# Patient Record
Sex: Female | Born: 1950 | Race: White | Hispanic: No | Marital: Single | Smoking: Former smoker
Health system: Southern US, Community
[De-identification: ages and names within clinical notes are randomized; demographics above are authoritative.]

## PROBLEM LIST (undated history)

## (undated) DIAGNOSIS — K219 Gastro-esophageal reflux disease without esophagitis: Secondary | ICD-10-CM

## (undated) DIAGNOSIS — I10 Essential (primary) hypertension: Secondary | ICD-10-CM

## (undated) DIAGNOSIS — F329 Major depressive disorder, single episode, unspecified: Secondary | ICD-10-CM

## (undated) DIAGNOSIS — Z87442 Personal history of urinary calculi: Secondary | ICD-10-CM

## (undated) DIAGNOSIS — M810 Age-related osteoporosis without current pathological fracture: Secondary | ICD-10-CM

## (undated) DIAGNOSIS — M1990 Unspecified osteoarthritis, unspecified site: Secondary | ICD-10-CM

## (undated) DIAGNOSIS — Z8489 Family history of other specified conditions: Secondary | ICD-10-CM

## (undated) DIAGNOSIS — F32A Depression, unspecified: Secondary | ICD-10-CM

## (undated) HISTORY — PX: ADENOIDECTOMY: SUR15

## (undated) HISTORY — DX: Age-related osteoporosis without current pathological fracture: M81.0

## (undated) HISTORY — PX: SINUS EXPLORATION: SHX5214

## (undated) HISTORY — PX: EYE SURGERY: SHX253

## (undated) HISTORY — PX: THIGH LIFT: SHX2496

---
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
## 1997-11-13 ENCOUNTER — Other Ambulatory Visit: Admission: RE | Admit: 1997-11-13 | Discharge: 1997-11-13 | Payer: Self-pay | Admitting: Family Medicine

## 2000-01-03 HISTORY — PX: ROUX-EN-Y GASTRIC BYPASS: SHX1104

## 2000-02-03 ENCOUNTER — Encounter: Admission: RE | Admit: 2000-02-03 | Discharge: 2000-05-03 | Payer: Self-pay | Admitting: Surgical Oncology

## 2001-03-04 ENCOUNTER — Encounter: Payer: Self-pay | Admitting: Urology

## 2001-03-04 ENCOUNTER — Ambulatory Visit (HOSPITAL_BASED_OUTPATIENT_CLINIC_OR_DEPARTMENT_OTHER): Admission: RE | Admit: 2001-03-04 | Discharge: 2001-03-04 | Payer: Self-pay | Admitting: Urology

## 2003-10-09 ENCOUNTER — Other Ambulatory Visit: Admission: RE | Admit: 2003-10-09 | Discharge: 2003-10-09 | Payer: Self-pay | Admitting: Family Medicine

## 2003-12-24 ENCOUNTER — Encounter: Admission: RE | Admit: 2003-12-24 | Discharge: 2003-12-24 | Payer: Self-pay | Admitting: Otolaryngology

## 2004-11-28 ENCOUNTER — Encounter: Admission: RE | Admit: 2004-11-28 | Discharge: 2004-11-28 | Payer: Self-pay | Admitting: Otolaryngology

## 2005-05-02 ENCOUNTER — Ambulatory Visit: Payer: Self-pay | Admitting: Sports Medicine

## 2007-01-29 ENCOUNTER — Other Ambulatory Visit: Admission: RE | Admit: 2007-01-29 | Discharge: 2007-01-29 | Payer: Self-pay | Admitting: Family Medicine

## 2009-05-05 ENCOUNTER — Inpatient Hospital Stay (HOSPITAL_COMMUNITY): Admission: EM | Admit: 2009-05-05 | Discharge: 2009-05-06 | Payer: Self-pay | Admitting: Emergency Medicine

## 2010-03-18 ENCOUNTER — Other Ambulatory Visit (HOSPITAL_COMMUNITY)
Admission: RE | Admit: 2010-03-18 | Discharge: 2010-03-18 | Disposition: A | Discharge status: Home / Self Care | Payer: Commercial Managed Care - PPO | Source: Ambulatory Visit | Attending: Family Medicine | Admitting: Family Medicine

## 2010-03-18 ENCOUNTER — Other Ambulatory Visit: Payer: Self-pay | Admitting: Family Medicine

## 2010-03-18 DIAGNOSIS — Z124 Encounter for screening for malignant neoplasm of cervix: Secondary | ICD-10-CM | POA: Insufficient documentation

## 2010-03-22 LAB — POCT I-STAT, CHEM 8
Chloride: 109 mEq/L (ref 96–112)
HCT: 39 % (ref 36.0–46.0)
Hemoglobin: 13.3 g/dL (ref 12.0–15.0)
Potassium: 3.3 mEq/L — ABNORMAL LOW (ref 3.5–5.1)
Sodium: 140 mEq/L (ref 135–145)

## 2010-03-22 LAB — COMPREHENSIVE METABOLIC PANEL
ALT: 15 U/L (ref 0–35)
AST: 16 U/L (ref 0–37)
AST: 24 U/L (ref 0–37)
Albumin: 2.3 g/dL — ABNORMAL LOW (ref 3.5–5.2)
Albumin: 2.6 g/dL — ABNORMAL LOW (ref 3.5–5.2)
Alkaline Phosphatase: 81 U/L (ref 39–117)
BUN: 15 mg/dL (ref 6–23)
BUN: 28 mg/dL — ABNORMAL HIGH (ref 6–23)
BUN: 9 mg/dL (ref 6–23)
CO2: 25 mEq/L (ref 19–32)
Calcium: 7.9 mg/dL — ABNORMAL LOW (ref 8.4–10.5)
Calcium: 8.8 mg/dL (ref 8.4–10.5)
Chloride: 110 mEq/L (ref 96–112)
Creatinine, Ser: 0.65 mg/dL (ref 0.4–1.2)
Creatinine, Ser: 1.11 mg/dL (ref 0.4–1.2)
GFR calc Af Amer: >60 mL/min (ref >60)
GFR calc Af Amer: >60 mL/min (ref >60)
GFR calc non Af Amer: 50 mL/min — ABNORMAL LOW (ref >60)
Potassium: 3.5 mEq/L (ref 3.5–5.1)
Sodium: 140 mEq/L (ref 135–145)
Total Bilirubin: 0.6 mg/dL (ref 0.3–1.2)
Total Bilirubin: 0.7 mg/dL (ref 0.3–1.2)
Total Protein: 4.7 g/dL — ABNORMAL LOW (ref 6.0–8.3)
Total Protein: 5.2 g/dL — ABNORMAL LOW (ref 6.0–8.3)

## 2010-03-22 LAB — DIFFERENTIAL
Basophils Absolute: 0 10*3/uL (ref 0.0–0.1)
Basophils Relative: 0 % (ref 0–1)
Eosinophils Absolute: 0.1 10*3/uL (ref 0.0–0.7)
Eosinophils Absolute: 0.3 10*3/uL (ref 0.0–0.7)
Eosinophils Relative: 3 % (ref 0–5)
Lymphocytes Relative: 14 % (ref 12–46)
Lymphocytes Relative: 5 % — ABNORMAL LOW (ref 12–46)
Monocytes Relative: 13 % — ABNORMAL HIGH (ref 3–12)
Monocytes Relative: 8 % (ref 3–12)
Neutro Abs: 6.8 10*3/uL (ref 1.7–7.7)
Neutro Abs: 8.8 10*3/uL — ABNORMAL HIGH (ref 1.7–7.7)
Neutrophils Relative %: 70 % (ref 43–77)
Neutrophils Relative %: 86 % — ABNORMAL HIGH (ref 43–77)
WBC Morphology: INCREASED

## 2010-03-22 LAB — CBC
HCT: 33.6 % — ABNORMAL LOW (ref 36.0–46.0)
HCT: 35.3 % — ABNORMAL LOW (ref 36.0–46.0)
HCT: 44.7 % (ref 36.0–46.0)
Hemoglobin: 11.6 g/dL — ABNORMAL LOW (ref 12.0–15.0)
Hemoglobin: 15.5 g/dL — ABNORMAL HIGH (ref 12.0–15.0)
MCHC: 34.6 g/dL (ref 30.0–36.0)
MCHC: 34.7 g/dL (ref 30.0–36.0)
MCV: 89.5 fL (ref 78.0–100.0)
MCV: 90.4 fL (ref 78.0–100.0)
Platelets: 198 10*3/uL (ref 150–400)
Platelets: 225 10*3/uL (ref 150–400)
Platelets: 307 10*3/uL (ref 150–400)
RBC: 4.95 MIL/uL (ref 3.87–5.11)
RDW: 12.6 % (ref 11.5–15.5)
RDW: 12.8 % (ref 11.5–15.5)
RDW: 13.1 % (ref 11.5–15.5)
WBC: 10.2 10*3/uL (ref 4.0–10.5)
WBC: 8.9 10*3/uL (ref 4.0–10.5)
WBC: 9.8 10*3/uL (ref 4.0–10.5)

## 2010-03-22 LAB — TISSUE TRANSGLUTAMINASE, IGA: Tissue Transglutaminase Ab, IgA: 10.4 U/mL (ref <20)

## 2010-03-22 LAB — CK TOTAL AND CKMB (NOT AT ARMC)
CK, MB: 3.5 ng/mL (ref 0.3–4.0)
Relative Index: INVALID (ref 0.0–2.5)
Total CK: 92 U/L (ref 7–177)

## 2010-03-22 LAB — FECAL LACTOFERRIN, QUANT

## 2010-03-22 LAB — STOOL CULTURE

## 2010-03-22 LAB — CLOSTRIDIUM DIFFICILE EIA

## 2010-03-22 LAB — LIPASE, BLOOD: Lipase: 13 U/L (ref 11–59)

## 2010-03-22 LAB — POTASSIUM: Potassium: 2.9 mEq/L — ABNORMAL LOW (ref 3.5–5.1)

## 2010-03-22 LAB — MAGNESIUM: Magnesium: 1.7 mg/dL (ref 1.5–2.5)

## 2013-05-14 ENCOUNTER — Other Ambulatory Visit: Payer: Self-pay | Admitting: Family Medicine

## 2013-05-14 ENCOUNTER — Other Ambulatory Visit (HOSPITAL_COMMUNITY)
Admission: RE | Admit: 2013-05-14 | Discharge: 2013-05-14 | Disposition: A | Discharge status: Home / Self Care | Payer: Commercial Managed Care - PPO | Source: Ambulatory Visit | Attending: Family Medicine | Admitting: Family Medicine

## 2013-05-14 DIAGNOSIS — Z124 Encounter for screening for malignant neoplasm of cervix: Secondary | ICD-10-CM | POA: Insufficient documentation

## 2015-06-17 DIAGNOSIS — Z961 Presence of intraocular lens: Secondary | ICD-10-CM | POA: Diagnosis not present

## 2015-06-18 DIAGNOSIS — Z23 Encounter for immunization: Secondary | ICD-10-CM | POA: Diagnosis not present

## 2015-06-18 DIAGNOSIS — Z0000 Encounter for general adult medical examination without abnormal findings: Secondary | ICD-10-CM | POA: Diagnosis not present

## 2015-06-18 DIAGNOSIS — Z1211 Encounter for screening for malignant neoplasm of colon: Secondary | ICD-10-CM | POA: Diagnosis not present

## 2015-06-18 DIAGNOSIS — E559 Vitamin D deficiency, unspecified: Secondary | ICD-10-CM | POA: Diagnosis not present

## 2015-06-18 DIAGNOSIS — I10 Essential (primary) hypertension: Secondary | ICD-10-CM | POA: Diagnosis not present

## 2015-06-18 DIAGNOSIS — M5136 Other intervertebral disc degeneration, lumbar region: Secondary | ICD-10-CM | POA: Diagnosis not present

## 2015-06-18 DIAGNOSIS — Z9884 Bariatric surgery status: Secondary | ICD-10-CM | POA: Diagnosis not present

## 2015-06-18 DIAGNOSIS — K912 Postsurgical malabsorption, not elsewhere classified: Secondary | ICD-10-CM | POA: Diagnosis not present

## 2015-06-18 DIAGNOSIS — Z13820 Encounter for screening for osteoporosis: Secondary | ICD-10-CM | POA: Diagnosis not present

## 2015-06-21 DIAGNOSIS — R69 Illness, unspecified: Secondary | ICD-10-CM | POA: Diagnosis not present

## 2015-06-23 DIAGNOSIS — Z1211 Encounter for screening for malignant neoplasm of colon: Secondary | ICD-10-CM | POA: Diagnosis not present

## 2015-07-01 DIAGNOSIS — Z13820 Encounter for screening for osteoporosis: Secondary | ICD-10-CM | POA: Diagnosis not present

## 2015-07-01 DIAGNOSIS — M8588 Other specified disorders of bone density and structure, other site: Secondary | ICD-10-CM | POA: Diagnosis not present

## 2015-07-16 DIAGNOSIS — Z1239 Encounter for other screening for malignant neoplasm of breast: Secondary | ICD-10-CM | POA: Diagnosis not present

## 2015-07-16 DIAGNOSIS — Z1231 Encounter for screening mammogram for malignant neoplasm of breast: Secondary | ICD-10-CM | POA: Diagnosis not present

## 2015-07-22 DIAGNOSIS — M9903 Segmental and somatic dysfunction of lumbar region: Secondary | ICD-10-CM | POA: Diagnosis not present

## 2015-07-22 DIAGNOSIS — M9905 Segmental and somatic dysfunction of pelvic region: Secondary | ICD-10-CM | POA: Diagnosis not present

## 2015-07-26 DIAGNOSIS — M5136 Other intervertebral disc degeneration, lumbar region: Secondary | ICD-10-CM | POA: Diagnosis not present

## 2015-07-26 DIAGNOSIS — M62830 Muscle spasm of back: Secondary | ICD-10-CM | POA: Diagnosis not present

## 2015-07-26 DIAGNOSIS — M5137 Other intervertebral disc degeneration, lumbosacral region: Secondary | ICD-10-CM | POA: Diagnosis not present

## 2015-07-26 DIAGNOSIS — M9903 Segmental and somatic dysfunction of lumbar region: Secondary | ICD-10-CM | POA: Diagnosis not present

## 2015-07-26 DIAGNOSIS — M5386 Other specified dorsopathies, lumbar region: Secondary | ICD-10-CM | POA: Diagnosis not present

## 2015-07-26 DIAGNOSIS — M9905 Segmental and somatic dysfunction of pelvic region: Secondary | ICD-10-CM | POA: Diagnosis not present

## 2015-07-26 DIAGNOSIS — M4606 Spinal enthesopathy, lumbar region: Secondary | ICD-10-CM | POA: Diagnosis not present

## 2015-07-26 DIAGNOSIS — M545 Low back pain: Secondary | ICD-10-CM | POA: Diagnosis not present

## 2015-07-27 DIAGNOSIS — M25561 Pain in right knee: Secondary | ICD-10-CM | POA: Diagnosis not present

## 2015-07-27 DIAGNOSIS — M9903 Segmental and somatic dysfunction of lumbar region: Secondary | ICD-10-CM | POA: Diagnosis not present

## 2015-07-27 DIAGNOSIS — M9905 Segmental and somatic dysfunction of pelvic region: Secondary | ICD-10-CM | POA: Diagnosis not present

## 2015-07-27 DIAGNOSIS — M1711 Unilateral primary osteoarthritis, right knee: Secondary | ICD-10-CM | POA: Diagnosis not present

## 2015-07-28 DIAGNOSIS — M5386 Other specified dorsopathies, lumbar region: Secondary | ICD-10-CM | POA: Diagnosis not present

## 2015-07-28 DIAGNOSIS — M9903 Segmental and somatic dysfunction of lumbar region: Secondary | ICD-10-CM | POA: Diagnosis not present

## 2015-07-28 DIAGNOSIS — M4606 Spinal enthesopathy, lumbar region: Secondary | ICD-10-CM | POA: Diagnosis not present

## 2015-07-28 DIAGNOSIS — M9905 Segmental and somatic dysfunction of pelvic region: Secondary | ICD-10-CM | POA: Diagnosis not present

## 2015-08-02 DIAGNOSIS — M9905 Segmental and somatic dysfunction of pelvic region: Secondary | ICD-10-CM | POA: Diagnosis not present

## 2015-08-02 DIAGNOSIS — M1711 Unilateral primary osteoarthritis, right knee: Secondary | ICD-10-CM | POA: Diagnosis not present

## 2015-08-02 DIAGNOSIS — M9903 Segmental and somatic dysfunction of lumbar region: Secondary | ICD-10-CM | POA: Diagnosis not present

## 2015-08-02 DIAGNOSIS — M25561 Pain in right knee: Secondary | ICD-10-CM | POA: Diagnosis not present

## 2015-08-03 DIAGNOSIS — M25561 Pain in right knee: Secondary | ICD-10-CM | POA: Diagnosis not present

## 2015-08-03 DIAGNOSIS — M1711 Unilateral primary osteoarthritis, right knee: Secondary | ICD-10-CM | POA: Diagnosis not present

## 2015-08-03 DIAGNOSIS — M9903 Segmental and somatic dysfunction of lumbar region: Secondary | ICD-10-CM | POA: Diagnosis not present

## 2015-08-03 DIAGNOSIS — M9905 Segmental and somatic dysfunction of pelvic region: Secondary | ICD-10-CM | POA: Diagnosis not present

## 2015-08-04 DIAGNOSIS — M4606 Spinal enthesopathy, lumbar region: Secondary | ICD-10-CM | POA: Diagnosis not present

## 2015-08-04 DIAGNOSIS — M9903 Segmental and somatic dysfunction of lumbar region: Secondary | ICD-10-CM | POA: Diagnosis not present

## 2015-08-04 DIAGNOSIS — M5386 Other specified dorsopathies, lumbar region: Secondary | ICD-10-CM | POA: Diagnosis not present

## 2015-08-04 DIAGNOSIS — M9905 Segmental and somatic dysfunction of pelvic region: Secondary | ICD-10-CM | POA: Diagnosis not present

## 2015-08-09 DIAGNOSIS — M9905 Segmental and somatic dysfunction of pelvic region: Secondary | ICD-10-CM | POA: Diagnosis not present

## 2015-08-09 DIAGNOSIS — M1711 Unilateral primary osteoarthritis, right knee: Secondary | ICD-10-CM | POA: Diagnosis not present

## 2015-08-09 DIAGNOSIS — M9903 Segmental and somatic dysfunction of lumbar region: Secondary | ICD-10-CM | POA: Diagnosis not present

## 2015-08-09 DIAGNOSIS — M25561 Pain in right knee: Secondary | ICD-10-CM | POA: Diagnosis not present

## 2015-08-10 DIAGNOSIS — M1711 Unilateral primary osteoarthritis, right knee: Secondary | ICD-10-CM | POA: Diagnosis not present

## 2015-08-10 DIAGNOSIS — M9905 Segmental and somatic dysfunction of pelvic region: Secondary | ICD-10-CM | POA: Diagnosis not present

## 2015-08-10 DIAGNOSIS — M25561 Pain in right knee: Secondary | ICD-10-CM | POA: Diagnosis not present

## 2015-08-10 DIAGNOSIS — M9903 Segmental and somatic dysfunction of lumbar region: Secondary | ICD-10-CM | POA: Diagnosis not present

## 2015-08-11 DIAGNOSIS — M9903 Segmental and somatic dysfunction of lumbar region: Secondary | ICD-10-CM | POA: Diagnosis not present

## 2015-08-11 DIAGNOSIS — M5386 Other specified dorsopathies, lumbar region: Secondary | ICD-10-CM | POA: Diagnosis not present

## 2015-08-11 DIAGNOSIS — M9905 Segmental and somatic dysfunction of pelvic region: Secondary | ICD-10-CM | POA: Diagnosis not present

## 2015-08-11 DIAGNOSIS — M4606 Spinal enthesopathy, lumbar region: Secondary | ICD-10-CM | POA: Diagnosis not present

## 2015-08-16 DIAGNOSIS — M9905 Segmental and somatic dysfunction of pelvic region: Secondary | ICD-10-CM | POA: Diagnosis not present

## 2015-08-16 DIAGNOSIS — M1711 Unilateral primary osteoarthritis, right knee: Secondary | ICD-10-CM | POA: Diagnosis not present

## 2015-08-16 DIAGNOSIS — M25561 Pain in right knee: Secondary | ICD-10-CM | POA: Diagnosis not present

## 2015-08-16 DIAGNOSIS — M9903 Segmental and somatic dysfunction of lumbar region: Secondary | ICD-10-CM | POA: Diagnosis not present

## 2015-08-17 DIAGNOSIS — M9903 Segmental and somatic dysfunction of lumbar region: Secondary | ICD-10-CM | POA: Diagnosis not present

## 2015-08-17 DIAGNOSIS — M9905 Segmental and somatic dysfunction of pelvic region: Secondary | ICD-10-CM | POA: Diagnosis not present

## 2015-08-18 DIAGNOSIS — M5384 Other specified dorsopathies, thoracic region: Secondary | ICD-10-CM | POA: Diagnosis not present

## 2015-08-18 DIAGNOSIS — M4604 Spinal enthesopathy, thoracic region: Secondary | ICD-10-CM | POA: Diagnosis not present

## 2015-08-18 DIAGNOSIS — M9903 Segmental and somatic dysfunction of lumbar region: Secondary | ICD-10-CM | POA: Diagnosis not present

## 2015-08-18 DIAGNOSIS — M9905 Segmental and somatic dysfunction of pelvic region: Secondary | ICD-10-CM | POA: Diagnosis not present

## 2015-08-24 DIAGNOSIS — M9903 Segmental and somatic dysfunction of lumbar region: Secondary | ICD-10-CM | POA: Diagnosis not present

## 2015-08-24 DIAGNOSIS — M4606 Spinal enthesopathy, lumbar region: Secondary | ICD-10-CM | POA: Diagnosis not present

## 2015-08-24 DIAGNOSIS — M5386 Other specified dorsopathies, lumbar region: Secondary | ICD-10-CM | POA: Diagnosis not present

## 2015-08-24 DIAGNOSIS — M9905 Segmental and somatic dysfunction of pelvic region: Secondary | ICD-10-CM | POA: Diagnosis not present

## 2015-08-25 DIAGNOSIS — M1711 Unilateral primary osteoarthritis, right knee: Secondary | ICD-10-CM | POA: Diagnosis not present

## 2015-08-25 DIAGNOSIS — M9905 Segmental and somatic dysfunction of pelvic region: Secondary | ICD-10-CM | POA: Diagnosis not present

## 2015-08-25 DIAGNOSIS — M9903 Segmental and somatic dysfunction of lumbar region: Secondary | ICD-10-CM | POA: Diagnosis not present

## 2015-08-25 DIAGNOSIS — M25561 Pain in right knee: Secondary | ICD-10-CM | POA: Diagnosis not present

## 2015-08-26 DIAGNOSIS — M4604 Spinal enthesopathy, thoracic region: Secondary | ICD-10-CM | POA: Diagnosis not present

## 2015-08-26 DIAGNOSIS — M9905 Segmental and somatic dysfunction of pelvic region: Secondary | ICD-10-CM | POA: Diagnosis not present

## 2015-08-26 DIAGNOSIS — M5384 Other specified dorsopathies, thoracic region: Secondary | ICD-10-CM | POA: Diagnosis not present

## 2015-08-26 DIAGNOSIS — M9903 Segmental and somatic dysfunction of lumbar region: Secondary | ICD-10-CM | POA: Diagnosis not present

## 2015-08-30 DIAGNOSIS — M9905 Segmental and somatic dysfunction of pelvic region: Secondary | ICD-10-CM | POA: Diagnosis not present

## 2015-08-30 DIAGNOSIS — M4604 Spinal enthesopathy, thoracic region: Secondary | ICD-10-CM | POA: Diagnosis not present

## 2015-08-30 DIAGNOSIS — M5384 Other specified dorsopathies, thoracic region: Secondary | ICD-10-CM | POA: Diagnosis not present

## 2015-08-30 DIAGNOSIS — M9903 Segmental and somatic dysfunction of lumbar region: Secondary | ICD-10-CM | POA: Diagnosis not present

## 2015-08-31 DIAGNOSIS — M1711 Unilateral primary osteoarthritis, right knee: Secondary | ICD-10-CM | POA: Diagnosis not present

## 2015-08-31 DIAGNOSIS — M9903 Segmental and somatic dysfunction of lumbar region: Secondary | ICD-10-CM | POA: Diagnosis not present

## 2015-08-31 DIAGNOSIS — M25561 Pain in right knee: Secondary | ICD-10-CM | POA: Diagnosis not present

## 2015-08-31 DIAGNOSIS — M9905 Segmental and somatic dysfunction of pelvic region: Secondary | ICD-10-CM | POA: Diagnosis not present

## 2015-09-01 DIAGNOSIS — M9905 Segmental and somatic dysfunction of pelvic region: Secondary | ICD-10-CM | POA: Diagnosis not present

## 2015-09-01 DIAGNOSIS — M25561 Pain in right knee: Secondary | ICD-10-CM | POA: Diagnosis not present

## 2015-09-01 DIAGNOSIS — M1711 Unilateral primary osteoarthritis, right knee: Secondary | ICD-10-CM | POA: Diagnosis not present

## 2015-09-01 DIAGNOSIS — M9903 Segmental and somatic dysfunction of lumbar region: Secondary | ICD-10-CM | POA: Diagnosis not present

## 2015-09-07 DIAGNOSIS — M4606 Spinal enthesopathy, lumbar region: Secondary | ICD-10-CM | POA: Diagnosis not present

## 2015-09-07 DIAGNOSIS — M5386 Other specified dorsopathies, lumbar region: Secondary | ICD-10-CM | POA: Diagnosis not present

## 2015-09-07 DIAGNOSIS — M9905 Segmental and somatic dysfunction of pelvic region: Secondary | ICD-10-CM | POA: Diagnosis not present

## 2015-09-07 DIAGNOSIS — M9903 Segmental and somatic dysfunction of lumbar region: Secondary | ICD-10-CM | POA: Diagnosis not present

## 2015-09-08 DIAGNOSIS — M5386 Other specified dorsopathies, lumbar region: Secondary | ICD-10-CM | POA: Diagnosis not present

## 2015-09-08 DIAGNOSIS — M4606 Spinal enthesopathy, lumbar region: Secondary | ICD-10-CM | POA: Diagnosis not present

## 2015-09-08 DIAGNOSIS — M9905 Segmental and somatic dysfunction of pelvic region: Secondary | ICD-10-CM | POA: Diagnosis not present

## 2015-09-08 DIAGNOSIS — M9903 Segmental and somatic dysfunction of lumbar region: Secondary | ICD-10-CM | POA: Diagnosis not present

## 2015-09-09 DIAGNOSIS — M9903 Segmental and somatic dysfunction of lumbar region: Secondary | ICD-10-CM | POA: Diagnosis not present

## 2015-09-09 DIAGNOSIS — M4604 Spinal enthesopathy, thoracic region: Secondary | ICD-10-CM | POA: Diagnosis not present

## 2015-09-09 DIAGNOSIS — M5384 Other specified dorsopathies, thoracic region: Secondary | ICD-10-CM | POA: Diagnosis not present

## 2015-09-09 DIAGNOSIS — M9905 Segmental and somatic dysfunction of pelvic region: Secondary | ICD-10-CM | POA: Diagnosis not present

## 2015-09-13 DIAGNOSIS — M5382 Other specified dorsopathies, cervical region: Secondary | ICD-10-CM | POA: Diagnosis not present

## 2015-09-13 DIAGNOSIS — M4602 Spinal enthesopathy, cervical region: Secondary | ICD-10-CM | POA: Diagnosis not present

## 2015-09-13 DIAGNOSIS — M9905 Segmental and somatic dysfunction of pelvic region: Secondary | ICD-10-CM | POA: Diagnosis not present

## 2015-09-13 DIAGNOSIS — M9903 Segmental and somatic dysfunction of lumbar region: Secondary | ICD-10-CM | POA: Diagnosis not present

## 2015-09-14 DIAGNOSIS — M9905 Segmental and somatic dysfunction of pelvic region: Secondary | ICD-10-CM | POA: Diagnosis not present

## 2015-09-14 DIAGNOSIS — M9903 Segmental and somatic dysfunction of lumbar region: Secondary | ICD-10-CM | POA: Diagnosis not present

## 2015-09-14 DIAGNOSIS — M5386 Other specified dorsopathies, lumbar region: Secondary | ICD-10-CM | POA: Diagnosis not present

## 2015-09-14 DIAGNOSIS — M4606 Spinal enthesopathy, lumbar region: Secondary | ICD-10-CM | POA: Diagnosis not present

## 2015-09-15 DIAGNOSIS — M4604 Spinal enthesopathy, thoracic region: Secondary | ICD-10-CM | POA: Diagnosis not present

## 2015-09-15 DIAGNOSIS — M9905 Segmental and somatic dysfunction of pelvic region: Secondary | ICD-10-CM | POA: Diagnosis not present

## 2015-09-15 DIAGNOSIS — M5384 Other specified dorsopathies, thoracic region: Secondary | ICD-10-CM | POA: Diagnosis not present

## 2015-09-15 DIAGNOSIS — M9903 Segmental and somatic dysfunction of lumbar region: Secondary | ICD-10-CM | POA: Diagnosis not present

## 2015-09-20 DIAGNOSIS — M4604 Spinal enthesopathy, thoracic region: Secondary | ICD-10-CM | POA: Diagnosis not present

## 2015-09-20 DIAGNOSIS — M9903 Segmental and somatic dysfunction of lumbar region: Secondary | ICD-10-CM | POA: Diagnosis not present

## 2015-09-20 DIAGNOSIS — M9905 Segmental and somatic dysfunction of pelvic region: Secondary | ICD-10-CM | POA: Diagnosis not present

## 2015-09-20 DIAGNOSIS — M5384 Other specified dorsopathies, thoracic region: Secondary | ICD-10-CM | POA: Diagnosis not present

## 2015-09-21 DIAGNOSIS — Z23 Encounter for immunization: Secondary | ICD-10-CM | POA: Diagnosis not present

## 2015-10-14 ENCOUNTER — Other Ambulatory Visit: Payer: Self-pay | Admitting: Family Medicine

## 2015-10-14 ENCOUNTER — Ambulatory Visit
Admission: RE | Admit: 2015-10-14 | Discharge: 2015-10-14 | Disposition: A | Discharge status: Home / Self Care | Payer: Commercial Managed Care - PPO | Source: Ambulatory Visit | Attending: Family Medicine | Admitting: Family Medicine

## 2015-10-14 DIAGNOSIS — M19072 Primary osteoarthritis, left ankle and foot: Secondary | ICD-10-CM | POA: Diagnosis not present

## 2015-10-14 DIAGNOSIS — R5383 Other fatigue: Secondary | ICD-10-CM | POA: Diagnosis not present

## 2015-10-14 DIAGNOSIS — R52 Pain, unspecified: Secondary | ICD-10-CM

## 2015-10-14 DIAGNOSIS — K912 Postsurgical malabsorption, not elsewhere classified: Secondary | ICD-10-CM | POA: Diagnosis not present

## 2015-10-14 DIAGNOSIS — M79672 Pain in left foot: Secondary | ICD-10-CM | POA: Diagnosis not present

## 2015-11-30 DIAGNOSIS — R5383 Other fatigue: Secondary | ICD-10-CM | POA: Diagnosis not present

## 2015-11-30 DIAGNOSIS — R69 Illness, unspecified: Secondary | ICD-10-CM | POA: Diagnosis not present

## 2016-01-12 DIAGNOSIS — M19079 Primary osteoarthritis, unspecified ankle and foot: Secondary | ICD-10-CM | POA: Diagnosis not present

## 2016-01-12 DIAGNOSIS — R69 Illness, unspecified: Secondary | ICD-10-CM | POA: Diagnosis not present

## 2016-05-05 ENCOUNTER — Ambulatory Visit
Admission: RE | Admit: 2016-05-05 | Discharge: 2016-05-05 | Disposition: A | Discharge status: Home / Self Care | Payer: Medicare HMO | Source: Ambulatory Visit | Attending: Family Medicine | Admitting: Family Medicine

## 2016-05-05 ENCOUNTER — Other Ambulatory Visit: Payer: Self-pay | Admitting: Family Medicine

## 2016-05-05 DIAGNOSIS — M25561 Pain in right knee: Secondary | ICD-10-CM

## 2016-05-05 DIAGNOSIS — M25562 Pain in left knee: Secondary | ICD-10-CM

## 2016-05-05 DIAGNOSIS — M1711 Unilateral primary osteoarthritis, right knee: Secondary | ICD-10-CM | POA: Diagnosis not present

## 2016-05-05 DIAGNOSIS — M1712 Unilateral primary osteoarthritis, left knee: Secondary | ICD-10-CM | POA: Diagnosis not present

## 2016-05-05 DIAGNOSIS — M25569 Pain in unspecified knee: Secondary | ICD-10-CM | POA: Diagnosis not present

## 2016-05-16 DIAGNOSIS — M1711 Unilateral primary osteoarthritis, right knee: Secondary | ICD-10-CM | POA: Diagnosis not present

## 2016-05-16 DIAGNOSIS — M1712 Unilateral primary osteoarthritis, left knee: Secondary | ICD-10-CM | POA: Diagnosis not present

## 2016-05-18 DIAGNOSIS — M25562 Pain in left knee: Secondary | ICD-10-CM | POA: Diagnosis not present

## 2016-05-18 DIAGNOSIS — M1712 Unilateral primary osteoarthritis, left knee: Secondary | ICD-10-CM | POA: Diagnosis not present

## 2016-05-18 DIAGNOSIS — M1711 Unilateral primary osteoarthritis, right knee: Secondary | ICD-10-CM | POA: Diagnosis not present

## 2016-05-18 DIAGNOSIS — M25561 Pain in right knee: Secondary | ICD-10-CM | POA: Diagnosis not present

## 2016-05-23 DIAGNOSIS — M1712 Unilateral primary osteoarthritis, left knee: Secondary | ICD-10-CM | POA: Diagnosis not present

## 2016-05-23 DIAGNOSIS — M25561 Pain in right knee: Secondary | ICD-10-CM | POA: Diagnosis not present

## 2016-05-23 DIAGNOSIS — M1711 Unilateral primary osteoarthritis, right knee: Secondary | ICD-10-CM | POA: Diagnosis not present

## 2016-05-23 DIAGNOSIS — M25562 Pain in left knee: Secondary | ICD-10-CM | POA: Diagnosis not present

## 2016-05-25 DIAGNOSIS — M1711 Unilateral primary osteoarthritis, right knee: Secondary | ICD-10-CM | POA: Diagnosis not present

## 2016-05-25 DIAGNOSIS — M25562 Pain in left knee: Secondary | ICD-10-CM | POA: Diagnosis not present

## 2016-05-25 DIAGNOSIS — M25561 Pain in right knee: Secondary | ICD-10-CM | POA: Diagnosis not present

## 2016-05-25 DIAGNOSIS — M1712 Unilateral primary osteoarthritis, left knee: Secondary | ICD-10-CM | POA: Diagnosis not present

## 2016-05-31 DIAGNOSIS — M1711 Unilateral primary osteoarthritis, right knee: Secondary | ICD-10-CM | POA: Diagnosis not present

## 2016-05-31 DIAGNOSIS — M25561 Pain in right knee: Secondary | ICD-10-CM | POA: Diagnosis not present

## 2016-05-31 DIAGNOSIS — M1712 Unilateral primary osteoarthritis, left knee: Secondary | ICD-10-CM | POA: Diagnosis not present

## 2016-05-31 DIAGNOSIS — M25562 Pain in left knee: Secondary | ICD-10-CM | POA: Diagnosis not present

## 2016-06-01 DIAGNOSIS — M25562 Pain in left knee: Secondary | ICD-10-CM | POA: Diagnosis not present

## 2016-06-01 DIAGNOSIS — M1711 Unilateral primary osteoarthritis, right knee: Secondary | ICD-10-CM | POA: Diagnosis not present

## 2016-06-01 DIAGNOSIS — M25561 Pain in right knee: Secondary | ICD-10-CM | POA: Diagnosis not present

## 2016-06-01 DIAGNOSIS — M1712 Unilateral primary osteoarthritis, left knee: Secondary | ICD-10-CM | POA: Diagnosis not present

## 2016-06-06 DIAGNOSIS — M25561 Pain in right knee: Secondary | ICD-10-CM | POA: Diagnosis not present

## 2016-06-06 DIAGNOSIS — M1712 Unilateral primary osteoarthritis, left knee: Secondary | ICD-10-CM | POA: Diagnosis not present

## 2016-06-06 DIAGNOSIS — M25562 Pain in left knee: Secondary | ICD-10-CM | POA: Diagnosis not present

## 2016-06-06 DIAGNOSIS — M1711 Unilateral primary osteoarthritis, right knee: Secondary | ICD-10-CM | POA: Diagnosis not present

## 2016-06-08 DIAGNOSIS — M1712 Unilateral primary osteoarthritis, left knee: Secondary | ICD-10-CM | POA: Diagnosis not present

## 2016-06-08 DIAGNOSIS — M1711 Unilateral primary osteoarthritis, right knee: Secondary | ICD-10-CM | POA: Diagnosis not present

## 2016-06-08 DIAGNOSIS — M25562 Pain in left knee: Secondary | ICD-10-CM | POA: Diagnosis not present

## 2016-06-08 DIAGNOSIS — M25561 Pain in right knee: Secondary | ICD-10-CM | POA: Diagnosis not present

## 2016-06-13 DIAGNOSIS — M1711 Unilateral primary osteoarthritis, right knee: Secondary | ICD-10-CM | POA: Diagnosis not present

## 2016-06-13 DIAGNOSIS — M1712 Unilateral primary osteoarthritis, left knee: Secondary | ICD-10-CM | POA: Diagnosis not present

## 2016-06-27 DIAGNOSIS — R69 Illness, unspecified: Secondary | ICD-10-CM | POA: Diagnosis not present

## 2016-06-29 DIAGNOSIS — Z961 Presence of intraocular lens: Secondary | ICD-10-CM | POA: Diagnosis not present

## 2016-06-29 DIAGNOSIS — H524 Presbyopia: Secondary | ICD-10-CM | POA: Diagnosis not present

## 2016-07-19 DIAGNOSIS — Z1231 Encounter for screening mammogram for malignant neoplasm of breast: Secondary | ICD-10-CM | POA: Diagnosis not present

## 2016-07-25 DIAGNOSIS — I10 Essential (primary) hypertension: Secondary | ICD-10-CM | POA: Diagnosis not present

## 2016-07-25 DIAGNOSIS — M19079 Primary osteoarthritis, unspecified ankle and foot: Secondary | ICD-10-CM | POA: Diagnosis not present

## 2016-07-25 DIAGNOSIS — E559 Vitamin D deficiency, unspecified: Secondary | ICD-10-CM | POA: Diagnosis not present

## 2016-07-25 DIAGNOSIS — Z0000 Encounter for general adult medical examination without abnormal findings: Secondary | ICD-10-CM | POA: Diagnosis not present

## 2016-07-25 DIAGNOSIS — Z1211 Encounter for screening for malignant neoplasm of colon: Secondary | ICD-10-CM | POA: Diagnosis not present

## 2016-07-25 DIAGNOSIS — Z23 Encounter for immunization: Secondary | ICD-10-CM | POA: Diagnosis not present

## 2016-07-25 DIAGNOSIS — R69 Illness, unspecified: Secondary | ICD-10-CM | POA: Diagnosis not present

## 2016-07-25 DIAGNOSIS — K912 Postsurgical malabsorption, not elsewhere classified: Secondary | ICD-10-CM | POA: Diagnosis not present

## 2016-07-25 DIAGNOSIS — M8580 Other specified disorders of bone density and structure, unspecified site: Secondary | ICD-10-CM | POA: Diagnosis not present

## 2016-07-27 ENCOUNTER — Other Ambulatory Visit: Payer: Self-pay | Admitting: Orthopedic Surgery

## 2016-08-08 ENCOUNTER — Ambulatory Visit (HOSPITAL_COMMUNITY)
Admission: RE | Admit: 2016-08-08 | Discharge: 2016-08-08 | Disposition: A | Discharge status: Home / Self Care | Payer: Medicare HMO | Source: Ambulatory Visit | Attending: Orthopedic Surgery | Admitting: Orthopedic Surgery

## 2016-08-08 ENCOUNTER — Encounter (HOSPITAL_COMMUNITY): Payer: Self-pay | Admitting: *Deleted

## 2016-08-08 ENCOUNTER — Encounter (HOSPITAL_COMMUNITY)
Admission: RE | Admit: 2016-08-08 | Discharge: 2016-08-08 | Disposition: A | Discharge status: Home / Self Care | Payer: Medicare HMO | Source: Ambulatory Visit | Attending: Orthopedic Surgery | Admitting: Orthopedic Surgery

## 2016-08-08 DIAGNOSIS — R918 Other nonspecific abnormal finding of lung field: Secondary | ICD-10-CM | POA: Diagnosis not present

## 2016-08-08 DIAGNOSIS — M1712 Unilateral primary osteoarthritis, left knee: Secondary | ICD-10-CM | POA: Diagnosis not present

## 2016-08-08 DIAGNOSIS — Z01818 Encounter for other preprocedural examination: Secondary | ICD-10-CM | POA: Diagnosis not present

## 2016-08-08 DIAGNOSIS — I491 Atrial premature depolarization: Secondary | ICD-10-CM | POA: Diagnosis not present

## 2016-08-08 HISTORY — DX: Unspecified osteoarthritis, unspecified site: M19.90

## 2016-08-08 HISTORY — DX: Essential (primary) hypertension: I10

## 2016-08-08 HISTORY — DX: Personal history of urinary calculi: Z87.442

## 2016-08-08 HISTORY — DX: Gastro-esophageal reflux disease without esophagitis: K21.9

## 2016-08-08 HISTORY — DX: Family history of other specified conditions: Z84.89

## 2016-08-08 LAB — URINALYSIS, ROUTINE W REFLEX MICROSCOPIC
Bilirubin Urine: NEGATIVE
GLUCOSE, UA: NEGATIVE mg/dL
HGB URINE DIPSTICK: NEGATIVE
KETONES UR: NEGATIVE mg/dL
Nitrite: NEGATIVE
PROTEIN: NEGATIVE mg/dL
Specific Gravity, Urine: 1.025 (ref 1.005–1.030)
pH: 5.5 (ref 5.0–8.0)

## 2016-08-08 LAB — SURGICAL PCR SCREEN
MRSA, PCR: NEGATIVE
Staphylococcus aureus: NEGATIVE

## 2016-08-08 LAB — CBC WITH DIFFERENTIAL/PLATELET
BASOS PCT: 0 %
Basophils Absolute: 0 10*3/uL (ref 0.0–0.1)
Eosinophils Absolute: 0.2 10*3/uL (ref 0.0–0.7)
Eosinophils Relative: 2 %
HEMATOCRIT: 38.5 % (ref 36.0–46.0)
HEMOGLOBIN: 12.5 g/dL (ref 12.0–15.0)
LYMPHS PCT: 24 %
Lymphs Abs: 1.9 10*3/uL (ref 0.7–4.0)
MCH: 29.6 pg (ref 26.0–34.0)
MCHC: 32.5 g/dL (ref 30.0–36.0)
MCV: 91.2 fL (ref 78.0–100.0)
MONOS PCT: 8 %
Monocytes Absolute: 0.7 10*3/uL (ref 0.1–1.0)
NEUTROS ABS: 5.4 10*3/uL (ref 1.7–7.7)
NEUTROS PCT: 66 %
Platelets: 284 10*3/uL (ref 150–400)
RBC: 4.22 MIL/uL (ref 3.87–5.11)
RDW: 13 % (ref 11.5–15.5)
WBC: 8.2 10*3/uL (ref 4.0–10.5)

## 2016-08-08 LAB — BASIC METABOLIC PANEL
ANION GAP: 9 (ref 5–15)
BUN: 20 mg/dL (ref 6–20)
CALCIUM: 9.7 mg/dL (ref 8.9–10.3)
CHLORIDE: 102 mmol/L (ref 101–111)
CO2: 29 mmol/L (ref 22–32)
Creatinine, Ser: 0.92 mg/dL (ref 0.44–1.00)
GFR calc non Af Amer: >60 mL/min (ref >60)
GLUCOSE: 85 mg/dL (ref 65–99)
POTASSIUM: 4.4 mmol/L (ref 3.5–5.1)
Sodium: 140 mmol/L (ref 135–145)

## 2016-08-08 LAB — APTT: aPTT: 29 seconds (ref 24–36)

## 2016-08-08 LAB — TYPE AND SCREEN
ABO/RH(D): O NEG
Antibody Screen: NEGATIVE

## 2016-08-08 LAB — URINALYSIS, MICROSCOPIC (REFLEX): RBC / HPF: NONE SEEN RBC/hpf (ref 0–5)

## 2016-08-08 LAB — ABO/RH: ABO/RH(D): O NEG

## 2016-08-08 LAB — PROTIME-INR
INR: 0.94
Prothrombin Time: 12.5 seconds (ref 11.4–15.2)

## 2016-08-08 NOTE — Pre-Procedure Instructions (Addendum)
Laura Bean  08/08/2016      [id number] - [address], [address] [place] [address] Phone: [phone] Fax: [phone]    Your procedure is scheduled on 08/14/16.  Report to [facility] Admitting at 530 A.M.  Call this number if you have problems the morning of surgery:  [phone]   Remember:  Do not eat food or drink liquids after midnight.  Take these medicines the morning of surgery with A SIP OF WATER  Bupropion(welbutrin),  STOP all herbel meds, nsaids (aleve,naproxen,advil,ibuprofen) prior to surgery starting today 08/08/16 including all vitamins/supplements,aspirin,glucosamine,goodys, bc powders Fish oil   Do not wear jewelry, make-up or nail polish.  Do not wear lotions, powders, or perfumes, or deoderant.  Do not shave 48 hours prior to surgery.  Men may shave face and neck.  Do not bring valuables to the hospital.  [facility] is not responsible for any belongings or valuables.  Contacts, dentures or bridgework may not be worn into surgery.  Leave your suitcase in the car.  After surgery it may be brought to your room.  For patients admitted to the hospital, discharge time will be determined by your treatment team.  Patients discharged the day of surgery will not be allowed to drive home.   Special instructions:   Special Instructions: [place] - Preparing for Surgery  Before surgery, you can play an important role.  Because skin is not sterile, your skin needs to be as free of germs as possible.  You can reduce the number of germs on you skin by washing with CHG (chlorahexidine gluconate) soap before surgery.  CHG is an antiseptic cleaner which kills germs and bonds with the skin to continue killing germs even after washing.  Please DO NOT use if you have an allergy to CHG or antibacterial soaps.  If your skin becomes reddened/irritated stop using the CHG and inform your nurse when you arrive at Short  Stay.  Do not shave (including legs and underarms) for at least 48 hours prior to the first CHG shower.  You may shave your face.  Please follow these instructions carefully:   1.  Shower with CHG Soap the night before surgery and the morning of Surgery.  2.  If you choose to wash your hair, wash your hair first as usual with your normal shampoo.  3.  After you shampoo, rinse your hair and body thoroughly to remove the Shampoo.  4.  Use CHG as you would any other liquid soap.  You can apply chg directly  to the skin and wash gently with scrungie or a clean washcloth.  5.  Apply the CHG Soap to your body ONLY FROM THE NECK DOWN.  Do not use on open wounds or open sores.  Avoid contact with your eyes ears, mouth and genitals (private parts).  Wash genitals (private parts)       with your normal soap.  6.  Wash thoroughly, paying special attention to the area where your surgery will be performed.  7.  Thoroughly rinse your body with warm water from the neck down.  8.  DO NOT shower/wash with your normal soap after using and rinsing off the CHG Soap.  9.  Pat yourself dry with a clean towel.            10.  Wear clean pajamas.            11.  Place clean sheets  on your bed the night of your first shower and do not sleep with pets.  Day of Surgery  Do not apply any lotions/deodorants the morning of surgery.  Please wear clean clothes to the hospital/surgery center.  Please read over the  fact sheets that you were given.

## 2016-08-10 ENCOUNTER — Encounter: Payer: Self-pay | Admitting: Cardiology

## 2016-08-10 ENCOUNTER — Ambulatory Visit (INDEPENDENT_AMBULATORY_CARE_PROVIDER_SITE_OTHER): Payer: Medicare HMO | Admitting: Cardiology

## 2016-08-10 VITALS — BP 126/78 | HR 82 | Ht 63.0 in | Wt 188.1 lb

## 2016-08-10 DIAGNOSIS — I10 Essential (primary) hypertension: Secondary | ICD-10-CM | POA: Diagnosis not present

## 2016-08-10 DIAGNOSIS — I491 Atrial premature depolarization: Secondary | ICD-10-CM | POA: Diagnosis not present

## 2016-08-10 DIAGNOSIS — Z01810 Encounter for preprocedural cardiovascular examination: Secondary | ICD-10-CM

## 2016-08-10 NOTE — Progress Notes (Signed)
Cardiology Office Note:    Date:  08/10/2016   ID:  Laura Bean, DOB November 03, 1950, MRN [id number]  PCP:  Gaynelle Arabian, MD  Cardiologist:  Shirlee More, MD   Referring MD: Frederik Pear, MD  ASSESSMENT:    1. Preoperative cardiovascular examination   2. APC (atrial premature contractions)   3. Essential hypertension    PLAN:    In order of problems listed above:  Preoperative cardiovascular evaluation  Surgeon: Dr. Mayer Camel Procedure: Total knee arthroplasty  The surgery is elective scheduled for next week Active cardiac problems  none. The cardiac status is stable . The planned procedure is intermediate risk. The cardiac risk factors are single APCs noted on EKG asymptomatic  The functional capacity is 4 mets or greater  yes  Recent cardiac tests performed repeat EKG in my office today is normal  Given the above his overall risk for the planned procedure is low acceptable  Antiplatelet/ anticoagulant recommendation: None  Other cardiac medication or device recommendation: None  Anesthesia recommendation: None  Observation, monitoring,and postoperative test recommendation: If admitted to the hospital Place her monitored bed for the first 24 hours. The patient is optimized from a cardiology perspective: Yes, she is asymptomatic and frequent atrial premature beats without any evidence of cardiac disease and at this time I do no further preoperative cardiovascular evaluation.  Hypertension stable continue current treatment     Next appointment   Medication Adjustments/Labs and Tests Ordered: Current medicines are reviewed at length with the patient today.  Concerns regarding medicines are outlined above.  Orders Placed This Encounter  Procedures  . EKG 12-Lead   No orders of the defined types were placed in this encounter.    Chief Complaint  Patient presents with  . New Patient (Initial Visit)    Cardiac clearance for left knee surgery she had APC's on her EKG  Scheduled Monday morning     History of Present Illness:    Laura Bean is a 66 y.o. female who is being seen today for the evaluation of Abnormal EKG with atrial premature beats at the request of Frederik Pear, MD.   Past Medical History:  Diagnosis Date  . Arthritis   . Family history of adverse reaction to anesthesia    ? father confusion with anesthesia when older  . GERD (gastroesophageal reflux disease)    occ  . History of kidney stones   . Hypertension     Past Surgical History:  Procedure Laterality Date  . ADENOIDECTOMY     child  . ROUX-EN-Y GASTRIC BYPASS  2002  . SINUS EXPLORATION    . THIGH LIFT     outer    Current Medications: Current Meds  Medication Sig  . buPROPion (WELLBUTRIN XL) 300 MG 24 hr tablet Take 300 mg by mouth daily.  . calcium carbonate (OSCAL) 1500 (600 Ca) MG TABS tablet Take 1,200 mg by mouth at bedtime.  . cholecalciferol (VITAMIN D) 1000 units tablet Take 1,000 Units by mouth every evening.  . diphenhydramine-acetaminophen (TYLENOL PM) 25-500 MG TABS tablet Take 1 tablet by mouth at bedtime.  . Glucosamine 500 MG CAPS Take 500 mg by mouth at bedtime.  . naproxen (NAPROSYN) 500 MG tablet Take 500 mg by mouth 2 (two) times daily with a meal.  . valsartan-hydrochlorothiazide (DIOVAN-HCT) 80-12.5 MG tablet Take 1 tablet by mouth at bedtime.   . vitamin B-12 (CYANOCOBALAMIN) 1000 MCG tablet Take 1,000 mcg by mouth 2 (two) times a week. Monday & Friday  Allergies:   Lisinopril   Social History   Social History  . Marital status: Single    Spouse name: N/A  . Number of children: N/A  . Years of education: N/A   Social History Main Topics  . Smoking status: Former Smoker    Quit date: 08/08/2000  . Smokeless tobacco: Never Used  . Alcohol use Yes     Comment: rarely  . Drug use: No  . Sexual activity: Not Asked   Other Topics Concern  . None   Social History Narrative  . None     Family History: The patient's family  history includes Aortic stenosis in her father and mother; Arrhythmia in her mother.  ROS:   ROS Please see the history of present illness.     All other systems reviewed and are negative.  EKGs/Labs/Other Studies Reviewed:    The following studies were reviewed today: Preoperative EKG normal except for 2 atrial premature beats  EKG:  EKG is  ordered today.  The ekg ordered today demonstrates sinus rhythm normal no arrhythmia  Recent Labs: 08/08/2016: BUN 20; Creatinine, Ser 0.92; Hemoglobin 12.5; Platelets 284; Potassium 4.4; Sodium 140  Recent Lipid Panel No results found for: CHOL, TRIG, HDL, CHOLHDL, VLDL, LDLCALC, LDLDIRECT  Physical Exam:    VS:  BP 126/78   Pulse 82   Ht 5\' 3"  (1.6 m)   Wt 188 lb 1.3 oz (85.3 kg)   SpO2 97%   BMI 33.32 kg/m     Wt Readings from Last 3 Encounters:  08/10/16 188 lb 1.3 oz (85.3 kg)  08/08/16 188 lb 12.8 oz (85.6 kg)     GEN:  Well nourished, well developed in no acute distress HEENT: Normal NECK: No JVD; No carotid bruits LYMPHATICS: No lymphadenopathy CARDIAC: RRR, no murmurs, rubs, gallops RESPIRATORY:  Clear to auscultation without rales, wheezing or rhonchi  ABDOMEN: Soft, non-tender, non-distended MUSCULOSKELETAL:  No edema; No deformity  SKIN: Warm and dry NEUROLOGIC:  Alert and oriented x 3 PSYCHIATRIC:  Normal affect     Signed, Shirlee More, MD  08/10/2016 12:48 PM    [facility]

## 2016-08-10 NOTE — Patient Instructions (Addendum)
Medication Instructions:  Your physician recommends that you continue on your current medications as directed. Please refer to the Current Medication list given to you today.   Labwork: None  Testing/Procedures: You had an EKG today.  Follow-Up: Your physician recommends that you schedule a follow-up appointment if symptoms worsen or fail to improve.   Any Other Special Instructions Will Be Listed Below (If Applicable).     If you need a refill on your cardiac medications before your next appointment, please call your pharmacy.    1. Avoid all over-the-counter antihistamines except Claritin/Loratadine and Zyrtec/Cetrizine. 2. Avoid all combination including cold sinus allergies flu decongestant and sleep medications 3. You can use Robitussin DM Mucinex and Mucinex DM for cough. 4. can use Tylenol aspirin ibuprofen and naproxen but no combinations such as sleep or sinus.

## 2016-08-11 DIAGNOSIS — M1712 Unilateral primary osteoarthritis, left knee: Secondary | ICD-10-CM | POA: Diagnosis present

## 2016-08-11 MED ORDER — DEXTROSE-NACL 5-0.45 % IV SOLN: INTRAVENOUS | Status: DC

## 2016-08-11 MED ORDER — TRANEXAMIC ACID 1000 MG/10ML IV SOLN
2000.0000 mg | INTRAVENOUS | Status: AC
  Administered 2016-08-14: 2000 mg via TOPICAL
  Filled 2016-08-11: qty 20

## 2016-08-11 MED ORDER — BUPIVACAINE LIPOSOME 1.3 % IJ SUSP
20.0000 mL | Freq: Once | INTRAMUSCULAR | Status: AC
  Administered 2016-08-14: 20 mL
  Filled 2016-08-11: qty 20

## 2016-08-11 MED ORDER — CEFAZOLIN SODIUM-DEXTROSE 2-4 GM/100ML-% IV SOLN
2.0000 g | INTRAVENOUS | Status: AC
  Administered 2016-08-14: 2 g via INTRAVENOUS
  Filled 2016-08-11: qty 100

## 2016-08-11 MED ORDER — TRANEXAMIC ACID 1000 MG/10ML IV SOLN
1000.0000 mg | INTRAVENOUS | Status: AC
  Administered 2016-08-14: 1000 mg via INTRAVENOUS
  Filled 2016-08-11: qty 10

## 2016-08-11 NOTE — H&P (Signed)
TOTAL KNEE ADMISSION H&P  Patient is being admitted for left total knee arthroplasty.  Subjective:  Chief Complaint:left knee pain.  HPI: Laura Bean, 66 y.o. female, has a history of pain and functional disability in the left knee due to arthritis and has failed non-surgical conservative treatments for greater than 12 weeks to includeNSAID's and/or analgesics, corticosteriod injections, viscosupplementation injections, flexibility and strengthening excercises, weight reduction as appropriate and activity modification.  Onset of symptoms was gradual, starting 2 years ago with gradually worsening course since that time. The patient noted no past surgery on the left knee(s).  Patient currently rates pain in the left knee(s) at 10 out of 10 with activity. Patient has night pain, worsening of pain with activity and weight bearing, pain that interferes with activities of daily living and crepitus.  Patient has evidence of joint space narrowing by imaging studies.  There is no active infection.  There are no active problems to display for this patient.  Past Medical History:  Diagnosis Date  . Arthritis   . Family history of adverse reaction to anesthesia    ? father confusion with anesthesia when older  . GERD (gastroesophageal reflux disease)    occ  . History of kidney stones   . Hypertension     Past Surgical History:  Procedure Laterality Date  . ADENOIDECTOMY     child  . ROUX-EN-Y GASTRIC BYPASS  2002  . SINUS EXPLORATION    . THIGH LIFT     outer    No prescriptions prior to admission.   Allergies  Allergen Reactions  . Lisinopril Cough    Social History  Substance Use Topics  . Smoking status: Former Smoker    Quit date: 08/08/2000  . Smokeless tobacco: Never Used  . Alcohol use Yes     Comment: rarely    Family History  Problem Relation Age of Onset  . Aortic stenosis Mother   . Arrhythmia Mother   . Aortic stenosis Father      Review of Systems   Constitutional: Negative.   HENT: Positive for tinnitus.   Eyes: Negative.   Respiratory: Negative.   Cardiovascular:       HTN  Gastrointestinal:       Reflux  Genitourinary:       Poor bladder control  Musculoskeletal: Positive for joint pain and myalgias.  Skin: Negative.   Neurological: Negative.   Endo/Heme/Allergies: Negative.   Psychiatric/Behavioral: Positive for depression.    Objective:  Physical Exam  Constitutional: She is oriented to person, place, and time. She appears well-developed and well-nourished.  HENT:  Head: Normocephalic and atraumatic.  Eyes: Pupils are equal, round, and reactive to light.  Neck: Normal range of motion. Neck supple.  Cardiovascular: Intact distal pulses.   Respiratory: Effort normal.  Musculoskeletal: She exhibits tenderness.  Subpatellar crepitus that is quite impressive.  Tender to palpation over the patellofemoral joints.  Range of motion is preserved as 0/125 with subpatellar pain as you approach full flexion.  Neurological: She is alert and oriented to person, place, and time.  Skin: Skin is warm and dry.  Psychiatric: She has a normal mood and affect. Her behavior is normal. Judgment and thought content normal.    Vital signs in last 24 hours: Pulse Rate:  [82] 82 (08/09 0926) BP: (126)/(78) 126/78 (08/09 0926) SpO2:  [97 %] 97 % (08/09 0926) Weight:  [85.3 kg (188 lb 1.3 oz)] 85.3 kg (188 lb 1.3 oz) (08/09 0926)  Labs:  Estimated body mass index is 33.32 kg/m as calculated from the following:   Height as of 08/10/16: 5\' 3"  (1.6 m).   Weight as of 08/10/16: 85.3 kg (188 lb 1.3 oz).   Imaging Review Plain radiographs demonstrate end stage patellofemoral joint degeneration and severe patellofemoral joint degeneration on the right and also shows CPPD arthropathy  Assessment/Plan:  End stage arthritis, left knee   The patient history, physical examination, clinical judgment of the provider and imaging studies are  consistent with end stage degenerative joint disease of the left knee(s) and total knee arthroplasty is deemed medically necessary. The treatment options including medical management, injection therapy arthroscopy and arthroplasty were discussed at length. The risks and benefits of total knee arthroplasty were presented and reviewed. The risks due to aseptic loosening, infection, stiffness, patella tracking problems, thromboembolic complications and other imponderables were discussed. The patient acknowledged the explanation, agreed to proceed with the plan and consent was signed. Patient is being admitted for inpatient treatment for surgery, pain control, PT, OT, prophylactic antibiotics, VTE prophylaxis, progressive ambulation and ADL's and discharge planning. The patient is planning to be discharged home with home health services

## 2016-08-13 NOTE — Anesthesia Preprocedure Evaluation (Signed)
Anesthesia Evaluation  Patient identified by MRN, date of birth, ID band Patient awake    Reviewed: Allergy & Precautions, NPO status , Patient's Chart, lab work & pertinent test results  Airway Mallampati: II  TM Distance: >3 FB Neck ROM: Full    Dental no notable dental hx.    Pulmonary neg pulmonary ROS, former smoker,    Pulmonary exam normal breath sounds clear to auscultation       Cardiovascular hypertension, negative cardio ROS Normal cardiovascular exam Rhythm:Regular Rate:Normal     Neuro/Psych negative neurological ROS  negative psych ROS   GI/Hepatic negative GI ROS, Neg liver ROS, GERD  ,  Endo/Other  negative endocrine ROS  Renal/GU negative Renal ROS  negative genitourinary   Musculoskeletal negative musculoskeletal ROS (+) Arthritis , Osteoarthritis,    Abdominal   Peds negative pediatric ROS (+)  Hematology negative hematology ROS (+)   Anesthesia Other Findings   Reproductive/Obstetrics negative OB ROS                             Anesthesia Physical Anesthesia Plan  ASA: II  Anesthesia Plan: Spinal   Post-op Pain Management:  Regional for Post-op pain   Induction: Intravenous  PONV Risk Score and Plan: 2 and Ondansetron, Dexamethasone, Treatment may vary due to age or medical condition and Midazolam  Airway Management Planned: Mask, Natural Airway and Nasal Cannula  Additional Equipment:   Intra-op Plan:   Post-operative Plan:   Informed Consent:   Plan Discussed with: CRNA and Surgeon  Anesthesia Plan Comments: ( )        Anesthesia Quick Evaluation

## 2016-08-14 ENCOUNTER — Inpatient Hospital Stay (HOSPITAL_COMMUNITY)
Admission: RE | Admit: 2016-08-14 | Discharge: 2016-08-16 | DRG: 470 | Disposition: A | Discharge status: Home Health | Payer: Medicare HMO | Attending: Orthopedic Surgery | Admitting: Orthopedic Surgery

## 2016-08-14 ENCOUNTER — Encounter (HOSPITAL_COMMUNITY)
Admission: RE | Disposition: A | Discharge status: Home Health | Payer: Self-pay | Source: Home / Self Care | Attending: Orthopedic Surgery

## 2016-08-14 ENCOUNTER — Inpatient Hospital Stay (HOSPITAL_COMMUNITY): Payer: Medicare HMO | Admitting: Anesthesiology

## 2016-08-14 ENCOUNTER — Encounter (HOSPITAL_COMMUNITY): Payer: Self-pay | Admitting: Surgery

## 2016-08-14 DIAGNOSIS — R69 Illness, unspecified: Secondary | ICD-10-CM | POA: Diagnosis not present

## 2016-08-14 DIAGNOSIS — F329 Major depressive disorder, single episode, unspecified: Secondary | ICD-10-CM | POA: Diagnosis present

## 2016-08-14 DIAGNOSIS — Z9884 Bariatric surgery status: Secondary | ICD-10-CM

## 2016-08-14 DIAGNOSIS — I10 Essential (primary) hypertension: Secondary | ICD-10-CM | POA: Diagnosis present

## 2016-08-14 DIAGNOSIS — Z87891 Personal history of nicotine dependence: Secondary | ICD-10-CM | POA: Diagnosis not present

## 2016-08-14 DIAGNOSIS — D62 Acute posthemorrhagic anemia: Secondary | ICD-10-CM | POA: Diagnosis not present

## 2016-08-14 DIAGNOSIS — M1712 Unilateral primary osteoarthritis, left knee: Secondary | ICD-10-CM | POA: Diagnosis not present

## 2016-08-14 DIAGNOSIS — K219 Gastro-esophageal reflux disease without esophagitis: Secondary | ICD-10-CM | POA: Diagnosis not present

## 2016-08-14 DIAGNOSIS — G8918 Other acute postprocedural pain: Secondary | ICD-10-CM | POA: Diagnosis not present

## 2016-08-14 HISTORY — PX: TOTAL KNEE ARTHROPLASTY: SHX125

## 2016-08-14 SURGERY — ARTHROPLASTY, KNEE, TOTAL
Anesthesia: Spinal | Laterality: Left

## 2016-08-14 MED ORDER — SENNOSIDES-DOCUSATE SODIUM 8.6-50 MG PO TABS
1.0000 | ORAL_TABLET | Freq: Every evening | ORAL | Status: DC | PRN
  Administered 2016-08-15: 1 via ORAL
  Filled 2016-08-14: qty 1

## 2016-08-14 MED ORDER — OXYCODONE HCL 5 MG PO TABS
ORAL_TABLET | ORAL | Status: AC
  Administered 2016-08-14: 10 mg
  Filled 2016-08-14: qty 2

## 2016-08-14 MED ORDER — BISACODYL 5 MG PO TBEC: 5.0000 mg | DELAYED_RELEASE_TABLET | Freq: Every day | ORAL | Status: DC | PRN

## 2016-08-14 MED ORDER — FENTANYL CITRATE (PF) 250 MCG/5ML IJ SOLN
INTRAMUSCULAR | Status: DC | PRN
  Administered 2016-08-14 (×2): 50 ug via INTRAVENOUS

## 2016-08-14 MED ORDER — ACETAMINOPHEN 500 MG PO TABS
500.0000 mg | ORAL_TABLET | Freq: Every day | ORAL | Status: DC
  Administered 2016-08-15: 500 mg via ORAL
  Filled 2016-08-14 (×2): qty 1

## 2016-08-14 MED ORDER — SODIUM CHLORIDE 0.9 % IR SOLN
Status: DC | PRN
  Administered 2016-08-14: 3000 mL

## 2016-08-14 MED ORDER — OXYCODONE HCL 5 MG PO TABS
5.0000 mg | ORAL_TABLET | ORAL | Status: DC | PRN
  Administered 2016-08-14 – 2016-08-16 (×10): 10 mg via ORAL
  Filled 2016-08-14 (×10): qty 2

## 2016-08-14 MED ORDER — CALCIUM CARBONATE 1500 (600 CA) MG PO TABS
1200.0000 mg | ORAL_TABLET | Freq: Every day | ORAL | Status: DC
  Filled 2016-08-14: qty 1

## 2016-08-14 MED ORDER — LACTATED RINGERS IV SOLN
INTRAVENOUS | Status: DC | PRN
  Administered 2016-08-14 (×2): via INTRAVENOUS

## 2016-08-14 MED ORDER — METOCLOPRAMIDE HCL 5 MG/ML IJ SOLN: 5.0000 mg | Freq: Three times a day (TID) | INTRAMUSCULAR | Status: DC | PRN

## 2016-08-14 MED ORDER — OXYCODONE-ACETAMINOPHEN 5-325 MG PO TABS: 1.0000 | ORAL_TABLET | ORAL | 0 refills | Status: DC | PRN

## 2016-08-14 MED ORDER — PROPOFOL 10 MG/ML IV BOLUS
INTRAVENOUS | Status: DC | PRN
  Administered 2016-08-14 (×2): 20 mg via INTRAVENOUS

## 2016-08-14 MED ORDER — GABAPENTIN 300 MG PO CAPS
300.0000 mg | ORAL_CAPSULE | Freq: Three times a day (TID) | ORAL | Status: DC
  Administered 2016-08-14 – 2016-08-16 (×6): 300 mg via ORAL
  Filled 2016-08-14 (×6): qty 1

## 2016-08-14 MED ORDER — PHENYLEPHRINE HCL 10 MG/ML IJ SOLN
INTRAVENOUS | Status: DC | PRN
  Administered 2016-08-14: 20 ug/min via INTRAVENOUS

## 2016-08-14 MED ORDER — ONDANSETRON HCL 4 MG/2ML IJ SOLN
INTRAMUSCULAR | Status: DC | PRN
  Administered 2016-08-14: 4 mg via INTRAVENOUS

## 2016-08-14 MED ORDER — VITAMIN B-12 1000 MCG PO TABS
1000.0000 ug | ORAL_TABLET | ORAL | Status: DC
  Administered 2016-08-14: 1000 ug via ORAL
  Filled 2016-08-14: qty 1

## 2016-08-14 MED ORDER — ROPIVACAINE HCL 5 MG/ML IJ SOLN
INTRAMUSCULAR | Status: DC | PRN
  Administered 2016-08-14: 25 mL via PERINEURAL

## 2016-08-14 MED ORDER — ACETAMINOPHEN 325 MG PO TABS
650.0000 mg | ORAL_TABLET | Freq: Four times a day (QID) | ORAL | Status: DC | PRN
  Administered 2016-08-16: 650 mg via ORAL
  Filled 2016-08-14: qty 2

## 2016-08-14 MED ORDER — ACETAMINOPHEN 650 MG RE SUPP
650.0000 mg | Freq: Four times a day (QID) | RECTAL | Status: DC | PRN
Start: 2016-08-14 — End: 2016-08-16

## 2016-08-14 MED ORDER — ONDANSETRON HCL 4 MG/2ML IJ SOLN: 4.0000 mg | Freq: Once | INTRAMUSCULAR | Status: DC | PRN

## 2016-08-14 MED ORDER — DEXAMETHASONE SODIUM PHOSPHATE 10 MG/ML IJ SOLN
10.0000 mg | Freq: Once | INTRAMUSCULAR | Status: AC
  Administered 2016-08-15: 10 mg via INTRAVENOUS
  Filled 2016-08-14: qty 1

## 2016-08-14 MED ORDER — KCL IN DEXTROSE-NACL 20-5-0.45 MEQ/L-%-% IV SOLN
INTRAVENOUS | Status: DC
  Administered 2016-08-14 (×2): via INTRAVENOUS
  Filled 2016-08-14 (×2): qty 1000

## 2016-08-14 MED ORDER — TRANEXAMIC ACID 1000 MG/10ML IV SOLN
1000.0000 mg | Freq: Once | INTRAVENOUS | Status: AC
  Administered 2016-08-14: 1000 mg via INTRAVENOUS
  Filled 2016-08-14: qty 10

## 2016-08-14 MED ORDER — FLEET ENEMA 7-19 GM/118ML RE ENEM: 1.0000 | ENEMA | Freq: Once | RECTAL | Status: DC | PRN

## 2016-08-14 MED ORDER — HYDROCHLOROTHIAZIDE 12.5 MG PO CAPS
12.5000 mg | ORAL_CAPSULE | Freq: Every day | ORAL | Status: DC
Start: 2016-08-14 — End: 2016-08-16
  Administered 2016-08-14 – 2016-08-16 (×3): 12.5 mg via ORAL
  Filled 2016-08-14 (×3): qty 1

## 2016-08-14 MED ORDER — CALCIUM CARBONATE 1250 (500 CA) MG PO TABS
1.0000 | ORAL_TABLET | Freq: Every day | ORAL | Status: DC
  Administered 2016-08-14 – 2016-08-15 (×2): 500 mg via ORAL
  Filled 2016-08-14 (×2): qty 1

## 2016-08-14 MED ORDER — IRBESARTAN 150 MG PO TABS
75.0000 mg | ORAL_TABLET | Freq: Every day | ORAL | Status: DC
  Administered 2016-08-14 – 2016-08-16 (×3): 75 mg via ORAL
  Filled 2016-08-14 (×3): qty 1

## 2016-08-14 MED ORDER — ALPRAZOLAM 0.25 MG PO TABS
ORAL_TABLET | ORAL | Status: AC
Start: 2016-08-14 — End: 2016-08-14
  Filled 2016-08-14: qty 4

## 2016-08-14 MED ORDER — FENTANYL CITRATE (PF) 250 MCG/5ML IJ SOLN
INTRAMUSCULAR | Status: AC
  Filled 2016-08-14: qty 5

## 2016-08-14 MED ORDER — DIPHENHYDRAMINE-APAP (SLEEP) 25-500 MG PO TABS: 1.0000 | ORAL_TABLET | Freq: Every day | ORAL | Status: DC

## 2016-08-14 MED ORDER — EPHEDRINE SULFATE-NACL 50-0.9 MG/10ML-% IV SOSY
PREFILLED_SYRINGE | INTRAVENOUS | Status: DC | PRN
  Administered 2016-08-14: 5 mg via INTRAVENOUS

## 2016-08-14 MED ORDER — HYDROMORPHONE HCL 1 MG/ML IJ SOLN: 0.5000 mg | INTRAMUSCULAR | Status: DC | PRN

## 2016-08-14 MED ORDER — DIPHENHYDRAMINE HCL 12.5 MG/5ML PO ELIX: 12.5000 mg | ORAL_SOLUTION | ORAL | Status: DC | PRN

## 2016-08-14 MED ORDER — ONDANSETRON HCL 4 MG/2ML IJ SOLN
INTRAMUSCULAR | Status: AC
  Filled 2016-08-14: qty 2

## 2016-08-14 MED ORDER — BUPIVACAINE-EPINEPHRINE (PF) 0.25% -1:200000 IJ SOLN
INTRAMUSCULAR | Status: DC | PRN
  Administered 2016-08-14: 20 mL via PERINEURAL
  Administered 2016-08-14: 30 mL via PERINEURAL

## 2016-08-14 MED ORDER — BUPIVACAINE IN DEXTROSE 0.75-8.25 % IT SOLN
INTRATHECAL | Status: DC | PRN
  Administered 2016-08-14: 11 mL via INTRATHECAL

## 2016-08-14 MED ORDER — 0.9 % SODIUM CHLORIDE (POUR BTL) OPTIME
TOPICAL | Status: DC | PRN
  Administered 2016-08-14: 1000 mL

## 2016-08-14 MED ORDER — MIDAZOLAM HCL 2 MG/2ML IJ SOLN
INTRAMUSCULAR | Status: AC
  Filled 2016-08-14: qty 2

## 2016-08-14 MED ORDER — CEFUROXIME SODIUM 1.5 G IV SOLR
INTRAVENOUS | Status: AC
  Filled 2016-08-14: qty 1.5

## 2016-08-14 MED ORDER — PROPOFOL 10 MG/ML IV BOLUS
INTRAVENOUS | Status: AC
  Filled 2016-08-14: qty 20

## 2016-08-14 MED ORDER — EPHEDRINE 5 MG/ML INJ
INTRAVENOUS | Status: AC
  Filled 2016-08-14: qty 10

## 2016-08-14 MED ORDER — MIDAZOLAM HCL 5 MG/5ML IJ SOLN
INTRAMUSCULAR | Status: DC | PRN
  Administered 2016-08-14: 2 mg via INTRAVENOUS

## 2016-08-14 MED ORDER — PROPOFOL 1000 MG/100ML IV EMUL
INTRAVENOUS | Status: AC
  Filled 2016-08-14: qty 100

## 2016-08-14 MED ORDER — DIPHENHYDRAMINE HCL 25 MG PO CAPS
25.0000 mg | ORAL_CAPSULE | Freq: Every day | ORAL | Status: DC
  Administered 2016-08-14 – 2016-08-15 (×2): 25 mg via ORAL
  Filled 2016-08-14 (×2): qty 1

## 2016-08-14 MED ORDER — TIZANIDINE HCL 2 MG PO TABS: 2.0000 mg | ORAL_TABLET | Freq: Four times a day (QID) | ORAL | 0 refills | Status: DC | PRN

## 2016-08-14 MED ORDER — PHENOL 1.4 % MT LIQD: 1.0000 | OROMUCOSAL | Status: DC | PRN

## 2016-08-14 MED ORDER — VALSARTAN-HYDROCHLOROTHIAZIDE 80-12.5 MG PO TABS: 1.0000 | ORAL_TABLET | Freq: Every day | ORAL | Status: DC

## 2016-08-14 MED ORDER — ASPIRIN EC 325 MG PO TBEC: 325.0000 mg | DELAYED_RELEASE_TABLET | Freq: Two times a day (BID) | ORAL | 0 refills | Status: DC

## 2016-08-14 MED ORDER — ONDANSETRON HCL 4 MG/2ML IJ SOLN: 4.0000 mg | Freq: Four times a day (QID) | INTRAMUSCULAR | Status: DC | PRN

## 2016-08-14 MED ORDER — ALUM & MAG HYDROXIDE-SIMETH 200-200-20 MG/5ML PO SUSP
30.0000 mL | ORAL | Status: DC | PRN
Start: 2016-08-14 — End: 2016-08-16

## 2016-08-14 MED ORDER — METHOCARBAMOL 500 MG PO TABS
ORAL_TABLET | ORAL | Status: AC
Start: 2016-08-14 — End: 2016-08-14
  Filled 2016-08-14: qty 1

## 2016-08-14 MED ORDER — MEPERIDINE HCL 25 MG/ML IJ SOLN: 6.2500 mg | INTRAMUSCULAR | Status: DC | PRN

## 2016-08-14 MED ORDER — DOCUSATE SODIUM 100 MG PO CAPS
100.0000 mg | ORAL_CAPSULE | Freq: Two times a day (BID) | ORAL | Status: DC
  Administered 2016-08-14 – 2016-08-16 (×5): 100 mg via ORAL
  Filled 2016-08-14 (×5): qty 1

## 2016-08-14 MED ORDER — CELECOXIB 200 MG PO CAPS
200.0000 mg | ORAL_CAPSULE | Freq: Two times a day (BID) | ORAL | Status: DC
  Administered 2016-08-14 – 2016-08-16 (×5): 200 mg via ORAL
  Filled 2016-08-14 (×4): qty 1

## 2016-08-14 MED ORDER — CHLORHEXIDINE GLUCONATE 4 % EX LIQD: 60.0000 mL | Freq: Once | CUTANEOUS | Status: DC

## 2016-08-14 MED ORDER — ONDANSETRON HCL 4 MG PO TABS: 4.0000 mg | ORAL_TABLET | Freq: Four times a day (QID) | ORAL | Status: DC | PRN

## 2016-08-14 MED ORDER — CEFUROXIME SODIUM 1.5 G IV SOLR
INTRAVENOUS | Status: DC | PRN
  Administered 2016-08-14: 1.5 g via INTRAVENOUS

## 2016-08-14 MED ORDER — METOCLOPRAMIDE HCL 5 MG PO TABS: 5.0000 mg | ORAL_TABLET | Freq: Three times a day (TID) | ORAL | Status: DC | PRN

## 2016-08-14 MED ORDER — PROPOFOL 500 MG/50ML IV EMUL
INTRAVENOUS | Status: DC | PRN
  Administered 2016-08-14: 100 ug/kg/min via INTRAVENOUS

## 2016-08-14 MED ORDER — GLUCOSAMINE 500 MG PO CAPS: 500.0000 mg | ORAL_CAPSULE | Freq: Every day | ORAL | Status: DC

## 2016-08-14 MED ORDER — SODIUM CHLORIDE 0.9 % IJ SOLN
INTRAMUSCULAR | Status: DC | PRN
  Administered 2016-08-14: 10 mL

## 2016-08-14 MED ORDER — DEXTROSE 5 % IV SOLN
500.0000 mg | Freq: Four times a day (QID) | INTRAVENOUS | Status: DC | PRN
  Filled 2016-08-14: qty 5

## 2016-08-14 MED ORDER — FENTANYL CITRATE (PF) 100 MCG/2ML IJ SOLN: 25.0000 ug | INTRAMUSCULAR | Status: DC | PRN

## 2016-08-14 MED ORDER — BUPROPION HCL ER (XL) 150 MG PO TB24
300.0000 mg | ORAL_TABLET | Freq: Every day | ORAL | Status: DC
  Administered 2016-08-15 – 2016-08-16 (×2): 300 mg via ORAL
  Filled 2016-08-14 (×2): qty 2

## 2016-08-14 MED ORDER — BUPIVACAINE-EPINEPHRINE (PF) 0.25% -1:200000 IJ SOLN
INTRAMUSCULAR | Status: AC
  Filled 2016-08-14: qty 60

## 2016-08-14 MED ORDER — METHOCARBAMOL 500 MG PO TABS
500.0000 mg | ORAL_TABLET | Freq: Four times a day (QID) | ORAL | Status: DC | PRN
  Administered 2016-08-14 – 2016-08-16 (×2): 500 mg via ORAL
  Filled 2016-08-14: qty 1

## 2016-08-14 MED ORDER — VITAMIN D 1000 UNITS PO TABS
1000.0000 [IU] | ORAL_TABLET | Freq: Every evening | ORAL | Status: DC
  Administered 2016-08-14 – 2016-08-15 (×2): 1000 [IU] via ORAL
  Filled 2016-08-14 (×2): qty 1

## 2016-08-14 MED ORDER — MENTHOL 3 MG MT LOZG: 1.0000 | LOZENGE | OROMUCOSAL | Status: DC | PRN

## 2016-08-14 MED ORDER — ASPIRIN EC 325 MG PO TBEC
325.0000 mg | DELAYED_RELEASE_TABLET | Freq: Every day | ORAL | Status: DC
  Administered 2016-08-15 – 2016-08-16 (×2): 325 mg via ORAL
  Filled 2016-08-14 (×2): qty 1

## 2016-08-14 SURGICAL SUPPLY — 56 items
BANDAGE ELASTIC 6 VELCRO ST LF (GAUZE/BANDAGES/DRESSINGS) ×1 IMPLANT
BANDAGE ESMARK 6X9 LF (GAUZE/BANDAGES/DRESSINGS) ×1 IMPLANT
BLADE SAG 18X100X1.27 (BLADE) ×2 IMPLANT
BLADE SAGITTAL 13X1.27X60 (BLADE) IMPLANT
BLADE SAW SGTL 13X75X1.27 (BLADE) ×1 IMPLANT
BNDG CMPR 9X6 STRL LF SNTH (GAUZE/BANDAGES/DRESSINGS) ×1
BNDG CMPR MED 10X6 ELC LF (GAUZE/BANDAGES/DRESSINGS) ×1
BNDG ELASTIC 6X10 VLCR STRL LF (GAUZE/BANDAGES/DRESSINGS) ×2 IMPLANT
BNDG ESMARK 6X9 LF (GAUZE/BANDAGES/DRESSINGS) ×2
BOWL SMART MIX CTS (DISPOSABLE) ×2 IMPLANT
CAPT KNEE TOTAL 3 ATTUNE ×1 IMPLANT
CEMENT HV SMART SET (Cement) ×4 IMPLANT
COVER SURGICAL LIGHT HANDLE (MISCELLANEOUS) ×2 IMPLANT
CUFF TOURNIQUET SINGLE 34IN LL (TOURNIQUET CUFF) ×2 IMPLANT
CUFF TOURNIQUET SINGLE 44IN (TOURNIQUET CUFF) IMPLANT
DRAPE EXTREMITY T 121X128X90 (DRAPE) ×2 IMPLANT
DRAPE U-SHAPE 47X51 STRL (DRAPES) ×2 IMPLANT
DRSG AQUACEL AG ADV 3.5X10 (GAUZE/BANDAGES/DRESSINGS) ×2 IMPLANT
DURAPREP 26ML APPLICATOR (WOUND CARE) ×2 IMPLANT
ELECT BLADE 4.0 EZ CLEAN MEGAD (MISCELLANEOUS) ×2
ELECT REM PT RETURN 9FT ADLT (ELECTROSURGICAL) ×2
ELECTRODE BLDE 4.0 EZ CLN MEGD (MISCELLANEOUS) IMPLANT
ELECTRODE REM PT RTRN 9FT ADLT (ELECTROSURGICAL) ×1 IMPLANT
FLUID NSS /IRRIG 3000 ML XXX (IV SOLUTION) ×1 IMPLANT
GLOVE BIO SURGEON STRL SZ7.5 (GLOVE) ×2 IMPLANT
GLOVE BIO SURGEON STRL SZ8.5 (GLOVE) ×2 IMPLANT
GLOVE BIOGEL PI IND STRL 8 (GLOVE) ×1 IMPLANT
GLOVE BIOGEL PI IND STRL 9 (GLOVE) ×1 IMPLANT
GLOVE BIOGEL PI INDICATOR 8 (GLOVE) ×1
GLOVE BIOGEL PI INDICATOR 9 (GLOVE) ×1
GOWN STRL REUS W/ TWL LRG LVL3 (GOWN DISPOSABLE) ×1 IMPLANT
GOWN STRL REUS W/ TWL XL LVL3 (GOWN DISPOSABLE) ×2 IMPLANT
GOWN STRL REUS W/TWL LRG LVL3 (GOWN DISPOSABLE) ×2
GOWN STRL REUS W/TWL XL LVL3 (GOWN DISPOSABLE) ×4
HANDPIECE INTERPULSE COAX TIP (DISPOSABLE) ×2
HOOD PEEL AWAY FACE SHEILD DIS (HOOD) ×4 IMPLANT
KIT BASIN OR (CUSTOM PROCEDURE TRAY) ×2 IMPLANT
KIT ROOM TURNOVER OR (KITS) ×2 IMPLANT
MANIFOLD NEPTUNE II (INSTRUMENTS) ×2 IMPLANT
NEEDLE 22X1 1/2 (OR ONLY) (NEEDLE) ×4 IMPLANT
NS IRRIG 1000ML POUR BTL (IV SOLUTION) ×2 IMPLANT
PACK TOTAL JOINT (CUSTOM PROCEDURE TRAY) ×2 IMPLANT
PAD ARMBOARD 7.5X6 YLW CONV (MISCELLANEOUS) ×3 IMPLANT
SET HNDPC FAN SPRY TIP SCT (DISPOSABLE) ×1 IMPLANT
SUT VIC AB 0 CT1 27 (SUTURE) ×2
SUT VIC AB 0 CT1 27XBRD ANBCTR (SUTURE) ×1 IMPLANT
SUT VIC AB 1 CTX 36 (SUTURE) ×2
SUT VIC AB 1 CTX36XBRD ANBCTR (SUTURE) ×1 IMPLANT
SUT VIC AB 2-0 CT1 27 (SUTURE) ×2
SUT VIC AB 2-0 CT1 TAPERPNT 27 (SUTURE) ×1 IMPLANT
SUT VIC AB 3-0 CT1 27 (SUTURE) ×2
SUT VIC AB 3-0 CT1 TAPERPNT 27 (SUTURE) ×1 IMPLANT
SYR CONTROL 10ML LL (SYRINGE) ×4 IMPLANT
TOWEL OR 17X24 6PK STRL BLUE (TOWEL DISPOSABLE) ×2 IMPLANT
TOWEL OR 17X26 10 PK STRL BLUE (TOWEL DISPOSABLE) ×2 IMPLANT
TRAY CATH 16FR W/PLASTIC CATH (SET/KITS/TRAYS/PACK) ×1 IMPLANT

## 2016-08-14 NOTE — Anesthesia Procedure Notes (Signed)
Anesthesia Regional Block: Adductor canal block   Pre-Anesthetic Checklist: ,, timeout performed, Correct Patient, Correct Site, Correct Laterality, Correct Procedure, Correct Position, site marked, Risks and benefits discussed,  Surgical consent,  Pre-op evaluation,  At surgeon's request and post-op pain management  Laterality: Left  Prep: chloraprep       Needles:  Injection technique: Single-shot  Needle Type: Echogenic Stimulator Needle     Needle Length: 5cm  Needle Gauge: 22     Additional Needles:   Procedures: ultrasound guided, nerve stimulator,,,,,,  Narrative:  Start time: 08/14/2016 7:10 AM End time: 08/14/2016 7:17 AM Injection made incrementally with aspirations every 5 mL.  Performed by: Personally  Anesthesiologist: Raseel Jans  Additional Notes: Functioning IV was confirmed and monitors were applied.  A 43mm 22ga Arrow echogenic stimulator needle was used. Sterile prep and drape,hand hygiene and sterile gloves were used. Ultrasound guidance: relevant anatomy identified, needle position confirmed, local anesthetic spread visualized around nerve(s)., vascular puncture avoided.  Image printed for medical record. Negative aspiration and negative test dose prior to incremental administration of local anesthetic. The patient tolerated the procedure well.

## 2016-08-14 NOTE — Discharge Instructions (Signed)

## 2016-08-14 NOTE — Progress Notes (Signed)
PHARMACIST - PHYSICIAN ORDER COMMUNICATION  CONCERNING: P&T Medication Policy on Herbal Medications  DESCRIPTION:  This patient's order for: Glucosamine  has been noted.  This product(s) is classified as an "herbal" or natural product. Due to a lack of definitive safety studies or FDA approval, nonstandard manufacturing practices, plus the potential risk of unknown drug-drug interactions while on inpatient medications, the Pharmacy and Therapeutics Committee does not permit the use of "herbal" or natural products of this type within [place].   ACTION TAKEN: The pharmacy department is unable to verify this order at this time and your patient has been informed of this safety policy. Please reevaluate patient's clinical condition at discharge and address if the herbal or natural product(s) should be resumed at that time.  

## 2016-08-14 NOTE — Anesthesia Postprocedure Evaluation (Signed)
Anesthesia Post Note  Patient: Laura Bean  Procedure(s) Performed: Procedure(s) (LRB): TOTAL KNEE ARTHROPLASTY (Left)     Anesthesia Post Evaluation  Last Vitals:  Vitals:   08/14/16 1005 08/14/16 1015  BP: 104/68 (!) 109/53  Pulse: (!) 58 60  Resp: 11 14  Temp:    SpO2: 99% 99%    Last Pain: There were no vitals filed for this visit.               Haylie Mccutcheon

## 2016-08-14 NOTE — Evaluation (Signed)
Physical Therapy Evaluation Patient Details Name: Laura Bean MRN: [id number] DOB: [birth year] Today's Date: 08/14/2016   History of Present Illness  Pt is a 66 y/o female s/p elective L TKA. PMH includes arthritis and HTN.   Clinical Impression  Pt is s/p surgery above with deficits below. PTA, pt reports she was independent with functional mobility. Upon eval, pt limited by post op pain and weakness, as well as, decreased balance. Required min guard throughout mobility for safety and steadying. Pt reports she lives alone, but her sister in law will be there to assist as needed upon d/c home. Follow up PT per MD arrangements and she has all necessary DME. Will continue to follow acutely to maximize functional mobility independence and safety.     Follow Up Recommendations DC plan and follow up therapy as arranged by surgeon;Supervision/Assistance - 24 hour    Equipment Recommendations  None recommended by PT (has all DME )    Recommendations for Other Services       Precautions / Restrictions Precautions Precautions: Knee Precaution Booklet Issued: Yes (comment) Precaution Comments: Reviewed exercise handout with pt.  Restrictions Weight Bearing Restrictions: Yes LLE Weight Bearing: Weight bearing as tolerated      Mobility  Bed Mobility Overal bed mobility: Needs Assistance Bed Mobility: Supine to Sit     Supine to sit: Supervision     General bed mobility comments: Supervison for safety, however, no assist required.   Transfers Overall transfer level: Needs assistance Equipment used: Rolling walker (2 wheeled) Transfers: Sit to/from Stand Sit to Stand: Min guard         General transfer comment: Min guard for safety. Demonstrated good hand placement.   Ambulation/Gait Ambulation/Gait assistance: Min guard Ambulation Distance (Feet): 25 Feet Assistive device: Rolling walker (2 wheeled) Gait Pattern/deviations: Step-to pattern;Decreased step length -  right;Decreased step length - left;Decreased weight shift to left;Antalgic Gait velocity: Decreased Gait velocity interpretation: Below normal speed for age/gender General Gait Details: Slow, antalgic gait secondary to post op pain and weakness. Verbal cues for sequencing with RW.   Stairs            Wheelchair Mobility    Modified Rankin (Stroke Patients Only)       Balance Overall balance assessment: Needs assistance Sitting-balance support: No upper extremity supported;Feet supported Sitting balance-Leahy Scale: Good     Standing balance support: Bilateral upper extremity supported;During functional activity Standing balance-Leahy Scale: Poor Standing balance comment: Reliant on RW for stability                              Pertinent Vitals/Pain Pain Assessment: 0-10 Pain Score: 2  Pain Location: L knee  Pain Descriptors / Indicators: Aching;Operative site guarding Pain Intervention(s): Limited activity within patient's tolerance;Monitored during session;Repositioned    Home Living Family/patient expects to be discharged to:: Private residence Living Arrangements: Alone (SIL) Available Help at Discharge: Family;Available 24 hours/day Type of Home: House Home Access: Stairs to enter Entrance Stairs-Rails: Psychiatric nurse of Steps: 4 Home Layout: One level Home Equipment: Walker - 2 wheels;Cane - single point;Bedside commode;Grab bars - tub/shower;Adaptive equipment      Prior Function Level of Independence: Independent               Hand Dominance   Dominant Hand: Right    Extremity/Trunk Assessment   Upper Extremity Assessment Upper Extremity Assessment: Defer to OT evaluation    Lower Extremity Assessment Lower  Extremity Assessment: LLE deficits/detail LLE Deficits / Details: Senosry in tact. Deficits consistent with post op pain and weakness. Able to do exercises below.     Cervical / Trunk  Assessment Cervical / Trunk Assessment: Normal  Communication   Communication: No difficulties  Cognition Arousal/Alertness: Awake/alert Behavior During Therapy: WFL for tasks assessed/performed Overall Cognitive Status: Within Functional Limits for tasks assessed                                        General Comments      Exercises Total Joint Exercises Ankle Circles/Pumps: AROM;Both;10 reps;Supine Quad Sets: AROM;Left;10 reps;Supine Towel Squeeze: AROM;Both;10 reps;Supine Short Arc Quad: AROM;Left;10 reps;Supine Heel Slides: AROM;Both;10 reps;Supine Hip ABduction/ADduction: AROM;Both;10 reps;Supine   Assessment/Plan    PT Assessment Patient needs continued PT services  PT Problem List Decreased strength;Decreased range of motion;Decreased balance;Decreased mobility;Decreased knowledge of use of DME;Decreased safety awareness;Decreased knowledge of precautions;Pain       PT Treatment Interventions DME instruction;Gait training;Functional mobility training;Stair training;Therapeutic activities;Therapeutic exercise;Balance training;Neuromuscular re-education;Patient/family education    PT Goals (Current goals can be found in the Care Plan section)  Acute Rehab PT Goals Patient Stated Goal: to go home  PT Goal Formulation: With patient Time For Goal Achievement: 08/21/16 Potential to Achieve Goals: Good    Frequency 7X/week   Barriers to discharge        Co-evaluation               AM-PAC PT "6 Clicks" Daily Activity  Outcome Measure Difficulty turning over in bed (including adjusting bedclothes, sheets and blankets)?: A Little Difficulty moving from lying on back to sitting on the side of the bed? : A Little Difficulty sitting down on and standing up from a chair with arms (e.g., wheelchair, bedside commode, etc,.)?: Total Help needed moving to and from a bed to chair (including a wheelchair)?: A Little Help needed walking in hospital room?: A  Little Help needed climbing 3-5 steps with a railing? : A Little 6 Click Score: 16    End of Session Equipment Utilized During Treatment: Gait belt Activity Tolerance: Patient tolerated treatment well Patient left: in chair;with call bell/phone within reach;with family/visitor present Nurse Communication: Mobility status PT Visit Diagnosis: Other abnormalities of gait and mobility (R26.89);Pain Pain - Right/Left: Left Pain - part of body: Knee    Time: 3536-1443 PT Time Calculation (min) (ACUTE ONLY): 25 min   Charges:   PT Evaluation $PT Eval Low Complexity: 1 Low PT Treatments $Gait Training: 8-22 mins   PT G Codes:        Leighton Ruff, PT, DPT  Acute Rehabilitation Services  Pager: [phone]   Rudean Hitt 08/14/2016, 4:50 PM

## 2016-08-14 NOTE — Transfer of Care (Signed)
Immediate Anesthesia Transfer of Care Note  Patient: Laura Bean  Procedure(s) Performed: Procedure(s): TOTAL KNEE ARTHROPLASTY (Left)  Patient Location: PACU  Anesthesia Type:Spinal  Level of Consciousness: drowsy and patient cooperative  Airway & Oxygen Therapy: Patient Spontanous Breathing and Patient connected to face mask oxygen  Post-op Assessment: Report given to RN and Post -op Vital signs reviewed and stable  Post vital signs: Reviewed and stable  Last Vitals:  Vitals:   08/14/16 0602  BP: 117/81  Pulse: 65  Resp: 18  Temp: 36.4 C  SpO2: 100%    Last Pain: There were no vitals filed for this visit.    Patients Stated Pain Goal: 5 (66/44/03 4742)  Complications: No apparent anesthesia complications

## 2016-08-14 NOTE — Anesthesia Procedure Notes (Signed)
Spinal  Patient location during procedure: OR Start time: 08/14/2016 7:35 AM End time: 08/14/2016 7:45 AM Staffing Anesthesiologist: Damarkus Balis Preanesthetic Checklist Completed: patient identified, site marked, surgical consent, pre-op evaluation, timeout performed, IV checked, risks and benefits discussed and monitors and equipment checked Spinal Block Patient position: sitting Prep: DuraPrep Patient monitoring: heart rate, cardiac monitor, continuous pulse ox and blood pressure Approach: midline Location: L4-5 Injection technique: single-shot Needle Needle type: Sprotte  Needle gauge: 24 G Needle length: 9 cm Assessment Sensory level: T4

## 2016-08-14 NOTE — Interval H&P Note (Signed)
History and Physical Interval Note:  08/14/2016 7:14 AM  Laura Bean  has presented today for surgery, with the diagnosis of LEFT KNEE OSTEOARTHRITIS  The various methods of treatment have been discussed with the patient and family. After consideration of risks, benefits and other options for treatment, the patient has consented to  Procedure(s): TOTAL KNEE ARTHROPLASTY (Left) as a surgical intervention .  The patient's history has been reviewed, patient examined, no change in status, stable for surgery.  I have reviewed the patient's chart and labs.  Questions were answered to the patient's satisfaction.     Kerin Salen

## 2016-08-14 NOTE — Progress Notes (Signed)
Orthopedic Tech Progress Note Patient Details:  Laura Bean [birth year] [id number]  Ortho Devices Ortho Device/Splint Location: foot roll Ortho Device/Splint Interventions: Application   [place] Pink 08/14/2016, 12:07 PM

## 2016-08-14 NOTE — Op Note (Signed)
PATIENT ID:      Laura Bean  MRN:     [id number] DOB/AGE:    February 16, 1950 / 66 y.o.       OPERATIVE REPORT    DATE OF PROCEDURE:  08/14/2016       PREOPERATIVE DIAGNOSIS:   Left knee osteoarthritis, end-stage      Estimated body mass index is 33.32 kg/m as calculated from the following:   Height as of 08/10/16: 5\' 3"  (1.6 m).   Weight as of 08/10/16: 85.3 kg (188 lb 1.3 oz).                                                        POSTOPERATIVE DIAGNOSIS:   Left knee osteoarthritis, end-stage                                                                      PROCEDURE:  Procedure(s): TOTAL KNEE ARTHROPLASTY Using DepuyAttune RP implants #5LN Femur, #5Tibia, 5 mm Attune RP bearing, 35 Patella     SURGEON: Joshue Badal J    ASSISTANT:   [employer]   (Present and scrubbed throughout the case, critical for assistance with exposure, retraction, instrumentation, and closure.)         ANESTHESIA: Spinal, 20cc Exparel, 50cc 0.25% Marcaine  EBL: 300  FLUID REPLACEMENT: 1500 crystalloid  TOURNIQUET TIME: 36min  Drains: None  Tranexamic Acid: 1gm IV, 2gm topical  COMPLICATIONS:  None         INDICATIONS FOR PROCEDURE: The patient has  Left knee osteoarthritis, end-stage, Var deformities, XR shows bone on bone arthritis to Patello Femoral joint, lateral subluxation of tibia. Patient has failed all conservative measures including anti-inflammatory medicines, narcotics, attempts at  exercise and weight loss, cortisone injections and viscosupplementation.  Risks and benefits of surgery have been discussed, questions answered.   DESCRIPTION OF PROCEDURE: The patient identified by armband, received  IV antibiotics, in the holding area at [facility]. Patient taken to the operating room, appropriate anesthetic  monitors were attached, and Spinal anesthesia was  induced. Tourniquet  applied high to the operative thigh. Lateral post and foot positioner  applied to the table, the lower  extremity was then prepped and draped  in usual sterile fashion from the toes to the tourniquet. Time-out procedure was performed. We began the operation, with the knee flexed 120 degrees, by making the anterior midline incision starting at handbreadth above the patella going over the patella 1 cm medial to and 4 cm distal to the tibial tubercle. Small bleeders in the skin and the  subcutaneous tissue identified and cauterized. Transverse retinaculum was incised and reflected medially and a medial parapatellar arthrotomy was accomplished. the patella was everted and theprepatellar fat pad resected. The superficial medial collateral  ligament was then elevated from anterior to posterior along the proximal  flare of the tibia and anterior half of the menisci resected. The knee was hyperflexed exposing bone on bone arthritis. Peripheral and notch osteophytes as well as the cruciate ligaments were then resected. We continued to  work our way  around posteriorly along the proximal tibia, and externally  rotated the tibia subluxing it out from underneath the femur. A McHale  retractor was placed through the notch and a lateral Hohmann retractor  placed, and we then drilled through the proximal tibia in line with the  axis of the tibia followed by an intramedullary guide rod and 2-degree  posterior slope cutting guide. The tibial cutting guide, 3 degree posterior sloped, was pinned into place allowing resection of 6 mm of bone medially and 6 mm of bone laterally. Satisfied with the tibial resection, we then  entered the distal femur 2 mm anterior to the PCL origin with the  intramedullary guide rod and applied the distal femoral cutting guide  set at 9 mm, with 5 degrees of valgus. This was pinned along the  epicondylar axis. At this point, the distal femoral cut was accomplished without difficulty. We then sized for a #5LN femoral component and pinned the guide in 3 degrees of external rotation. The chamfer  cutting guide was pinned into place. The anterior, posterior, and chamfer cuts were accomplished without difficulty followed by  the Attune RP box cutting guide and the box cut. We also removed posterior osteophytes from the posterior femoral condyles. At this  time, the knee was brought into full extension. We checked our  extension and flexion gaps and found them symmetric for a 5 mm bearing. Distracting in extension with a lamina spreader, the posterior horns of the menisci were removed, and Exparel, diluted to 60 cc, with 20cc NS, and 20cc 0.5% Marcaine,was injected into the capsule and synovium of the knee. The posterior patella cut was accomplished with the 9.5 mm Attune cutting guide, sized for a 35mm dome, and the fixation pegs drilled.The knee  was then once again hyperflexed exposing the proximal tibia. We sized for a # 5 tibial base plate, applied the smokestack and the conical reamer followed by the the Delta fin keel punch. We then hammered into place the Attune RP trial femoral component, drilled the lugs, inserted a  5 mm trial bearing, trial patellar button, and took the knee through range of motion from 0-130 degrees. No thumb pressure was required for patellar Tracking. At this point, the limb was wrapped with an Esmarch bandage and the tourniquet inflated to 350 mmHg. All trial components were removed, mating surfaces irrigated with pulse lavage, and dried with suction and sponges. 10 cc of the Exparel solution was applied to the cancellus bone of the patella distal femur and proximal tibia.  After waiting 1 minute, the bony surfaces were again, dried with sponges. A double batch of DePuy HV cement with 1500 mg of Zinacef was mixed and applied to all bony metallic mating surfaces except for the posterior condyles of the femur itself. In order, we hammered into place the tibial tray and removed excess cement, the femoral component and removed excess cement. The final Attune RP bearing  was  inserted, and the knee brought to full extension with compression.  The patellar button was clamped into place, and excess cement  removed. While the cement cured the wound was irrigated out with normal saline solution pulse lavage. Ligament stability and patellar tracking were checked and found to be excellent. The parapatellar arthrotomy was closed with  running #1 Vicryl suture. The subcutaneous tissue with 0 and 2-0 undyed  Vicryl suture, and the skin with running 3-0 SQ vicryl. A dressing of Xeroform,  4 x 4, dressing sponges, Webril, and Ace wrap applied.  The patient  awakened, and taken to recovery room without difficulty.   Elim Peale J 08/14/2016, 8:50 AM

## 2016-08-15 ENCOUNTER — Encounter (HOSPITAL_COMMUNITY): Payer: Self-pay | Admitting: Orthopedic Surgery

## 2016-08-15 LAB — CBC
HEMATOCRIT: 31.9 % — AB (ref 36.0–46.0)
Hemoglobin: 10.2 g/dL — ABNORMAL LOW (ref 12.0–15.0)
MCH: 29.9 pg (ref 26.0–34.0)
MCHC: 32 g/dL (ref 30.0–36.0)
MCV: 93.5 fL (ref 78.0–100.0)
Platelets: 230 10*3/uL (ref 150–400)
RBC: 3.41 MIL/uL — AB (ref 3.87–5.11)
RDW: 13.2 % (ref 11.5–15.5)
WBC: 8.9 10*3/uL (ref 4.0–10.5)

## 2016-08-15 LAB — BASIC METABOLIC PANEL
ANION GAP: 4 — AB (ref 5–15)
BUN: 9 mg/dL (ref 6–20)
CALCIUM: 8.6 mg/dL — AB (ref 8.9–10.3)
CO2: 29 mmol/L (ref 22–32)
Chloride: 104 mmol/L (ref 101–111)
Creatinine, Ser: 0.77 mg/dL (ref 0.44–1.00)
GLUCOSE: 139 mg/dL — AB (ref 65–99)
POTASSIUM: 5 mmol/L (ref 3.5–5.1)
Sodium: 137 mmol/L (ref 135–145)

## 2016-08-15 NOTE — Progress Notes (Signed)
Physical Therapy Treatment Patient Details Name: Laura Bean MRN: [id number] DOB: October 15, 1950 Today's Date: 08/15/2016    History of Present Illness Pt is a 66 y/o female s/p elective L TKA. PMH includes arthritis and HTN.     PT Comments    Pt performed well during pm tx and continues to progress toward goals. Pt able to perform stair training with min guard for stability and min cueing for navigation sequencing. Pt able to recall and perform 5/6 HEP exercises with min cueing for form and velocity. Plan of care remains appropriate with focus of next tx on increasing HEP repetitions to increase overall LLE strength and endurance.   Follow Up Recommendations  DC plan and follow up therapy as arranged by surgeon;Supervision/Assistance - 24 hour     Equipment Recommendations  None recommended by PT    Recommendations for Other Services       Precautions / Restrictions Precautions Precautions: Knee Precaution Booklet Issued: No Precaution Comments: Reviewed resting with knee in ex and elevating at ankle Restrictions Weight Bearing Restrictions: Yes LLE Weight Bearing: Weight bearing as tolerated    Mobility  Bed Mobility Overal bed mobility: Modified Independent Bed Mobility: Supine to Sit     Supine to sit: Supervision     General bed mobility comments: No cues needed, use of bed rails to elevate upper trunk.   Transfers Overall transfer level: Modified independent Equipment used: Rolling walker (2 wheeled) Transfers: Sit to/from Stand Sit to Stand: Supervision         General transfer comment: Supervision for safety, no cues needed for hand placement.   Ambulation/Gait Ambulation/Gait assistance: Min guard Ambulation Distance (Feet): 195 Feet Assistive device: Rolling walker (2 wheeled) Gait Pattern/deviations: Step-through pattern;Decreased step length - right;Decreased stance time - left;Decreased weight shift to left;Antalgic Gait velocity: Decreased    General Gait Details: Pt presents with above deficits requiring vc's throughout ambulation to increase step length with RLE to assist increasing stance time on LLE. Pt also presented with slight instability when in stance phase on LLE.    Stairs Stairs: Yes  Min guard Stair Management: One rail Right;Step to pattern;Forwards Number of Stairs: 4 General stair comments: Min guard for stability, min cues for sequencing.   Wheelchair Mobility    Modified Rankin (Stroke Patients Only)       Balance Overall balance assessment: Needs assistance Sitting-balance support: No upper extremity supported;Feet supported Sitting balance-Leahy Scale: Good Sitting balance - Comments: Able to sit EOB no UE support while taking ROM measurments.    Standing balance support: Bilateral upper extremity supported;During functional activity Standing balance-Leahy Scale: Fair Standing balance comment: Pt able to static stand no UE support during RW adjustments.                             Cognition Arousal/Alertness: Awake/alert Behavior During Therapy: WFL for tasks assessed/performed Overall Cognitive Status: Within Functional Limits for tasks assessed                                        Exercises Total Joint Exercises Quad Sets: AROM;Left;10 reps;Supine Towel Squeeze: AROM;Both;10 reps;Supine Short Arc Quad: AROM;Left;10 reps;Supine Heel Slides: AROM;Left;10 reps;Supine Straight Leg Raises: AROM;Left;10 reps;Supine Other Exercises Other Exercises: x10 incentive spirometer.    General Comments        Pertinent Vitals/Pain Pain Assessment: 0-10 Pain  Score: 4  Pain Location: L knee  Pain Descriptors / Indicators: Aching;Constant;Operative site guarding Pain Intervention(s): Monitored during session;Ice applied    Home Living                      Prior Function            PT Goals (current goals can now be found in the care plan section)  Acute Rehab PT Goals Patient Stated Goal: to go home  PT Goal Formulation: With patient Potential to Achieve Goals: Good Progress towards PT goals: Progressing toward goals    Frequency    7X/week      PT Plan      Co-evaluation              AM-PAC PT "6 Clicks" Daily Activity  Outcome Measure  Difficulty turning over in bed (including adjusting bedclothes, sheets and blankets)?: A Little Difficulty moving from lying on back to sitting on the side of the bed? : A Little Difficulty sitting down on and standing up from a chair with arms (e.g., wheelchair, bedside commode, etc,.)?: A Lot Help needed moving to and from a bed to chair (including a wheelchair)?: A Little Help needed walking in hospital room?: A Little Help needed climbing 3-5 steps with a railing? : A Little 6 Click Score: 17    End of Session Equipment Utilized During Treatment: Gait belt Activity Tolerance: Patient tolerated treatment well Patient left: in bed;with call bell/phone within reach;with family/visitor present Nurse Communication: Mobility status PT Visit Diagnosis: Other abnormalities of gait and mobility (R26.89);Pain Pain - Right/Left: Left Pain - part of body: Knee     Time: 1555-1630 PT Time Calculation (min) (ACUTE ONLY): 35 min  Charges:  $Gait Training: 8-22 mins $Therapeutic Exercise: 8-22 mins                    G CodesRandalyn Rhea, [place] [phone]   Randalyn Rhea 08/15/2016, 4:53 PM

## 2016-08-15 NOTE — Progress Notes (Addendum)
PATIENT ID: Laura Bean  MRN: [id number]  DOB/AGE:  Jan 11, 1950 / 66 y.o.  1 Day Post-Op Procedure(s) (LRB): TOTAL KNEE ARTHROPLASTY (Left)    PROGRESS NOTE Subjective: Patient is alert, oriented, no Nausea, no Vomiting, yes passing gas. Taking PO well. Denies SOB, Chest or Calf Pain. Using Incentive Spirometer, PAS in place. Ambulate 20', Patient reports pain as 3/10 .    Objective: Vital signs in last 24 hours: Vitals:   08/14/16 1741 08/14/16 2001 08/15/16 0118 08/15/16 0454  BP: (!) 113/43 (!) 120/59 (!) 98/58 95/66  Pulse: 100 86 74 68  Resp:  16 16 16   Temp: 98 F (36.7 C) 98.3 F (36.8 C) 98.4 F (36.9 C) 98.4 F (36.9 C)  TempSrc: Oral Oral Oral Oral  SpO2: 94% 96% 96% 96%      Intake/Output from previous day: I/O last 3 completed shifts: In: 4016.3 [P.O.:460; I.V.:3406.3; Other:150] Out: 450 [Urine:400; Blood:50]   Intake/Output this shift: No intake/output data recorded.   LABORATORY DATA:  Recent Labs  08/15/16 0416  WBC 8.9  HGB 10.2*  HCT 31.9*  PLT 230  NA 137  K 5.0  CL 104  CO2 29  BUN 9  CREATININE 0.77  GLUCOSE 139*  CALCIUM 8.6*    Examination: Neurologically intact ABD soft Neurovascular intact Sensation intact distally Intact pulses distally Dorsiflexion/Plantar flexion intact Incision: dressing C/D/I No cellulitis present Compartment soft}  Assessment:   1 Day Post-Op Procedure(s) (LRB): TOTAL KNEE ARTHROPLASTY (Left) ADDITIONAL DIAGNOSIS: Expected Acute Blood Loss Anemia, Hypertension, kidney stones  Plan: PT/OT WBAT, AROM and PROM  DVT Prophylaxis:  SCDx72hrs, ASA 325 mg BID x 2 weeks DISCHARGE PLAN: Home DISCHARGE NEEDS: HHPT, Walker and 3-in-1 comode seat     Luan Urbani J 08/15/2016, 7:51 AM Patient ID: Laura Bean, female   DOB: [birth year], 66 y.o.   MRN: [id number]

## 2016-08-15 NOTE — Evaluation (Signed)
Occupational Therapy Evaluation Patient Details Name: Laura Bean MRN: [id number] DOB: Apr 19, 1950 Today's Date: 08/15/2016    History of Present Illness Pt is a 66 y/o female s/p elective L TKA. PMH includes arthritis and HTN.    Clinical Impression   PTA, pt was living alone and was independent; will have family stay at dc. Currently, pt requires Min Guard A for LB ADLs and functional mobility using RW. Provided education on LB ADLs, toilet transfer, and shower transfer with 3N1; pt demonstrated understanding.Pt would benefit from further acute OT to educate on AE and facilitate safe dc. Recommend dc home once medically stable per physician.     Follow Up Recommendations  DC plan and follow up therapy as arranged by surgeon;Supervision/Assistance - 24 hour    Equipment Recommendations  None recommended by OT    Recommendations for Other Services PT consult     Precautions / Restrictions Precautions Precautions: Knee Precaution Booklet Issued: No Precaution Comments: Reviewed resting with knee in ex and elevating at ankle Restrictions Weight Bearing Restrictions: Yes LLE Weight Bearing: Weight bearing as tolerated      Mobility Bed Mobility Overal bed mobility: Needs Assistance Bed Mobility: Supine to Sit     Supine to sit: Supervision     General bed mobility comments: Supervison for safety, however, no assist required.   Transfers Overall transfer level: Needs assistance Equipment used: Rolling walker (2 wheeled) Transfers: Sit to/from Stand Sit to Stand: Min guard         General transfer comment: Min guard for safety. Demonstrated good hand placement.     Balance Overall balance assessment: Needs assistance Sitting-balance support: No upper extremity supported;Feet supported Sitting balance-Leahy Scale: Good     Standing balance support: Bilateral upper extremity supported;During functional activity Standing balance-Leahy Scale: Fair Standing balance  comment: Performed LB ADLs without UE support                           ADL either performed or assessed with clinical judgement   ADL Overall ADL's : Needs assistance/impaired Eating/Feeding: Set up;Sitting   Grooming: Min guard;Standing   Upper Body Bathing: Set up;Sitting   Lower Body Bathing: Min guard;Sit to/from stand   Upper Body Dressing : Set up;Sitting Upper Body Dressing Details (indicate cue type and reason): Pt donned bra and shirt with set up at EOB Lower Body Dressing: Min guard;Sit to/from stand Lower Body Dressing Details (indicate cue type and reason): Donned underwear and pants with Min Guard A. Pt asking about donning socks with AE. Will educate at next session Toilet Transfer: Min guard;Ambulation;RW;BSC       Tub/ Shower Transfer: Walk-in shower;Min guard;3 in English as a second language teacher;Ambulation;Cueing for sequencing   Functional mobility during ADLs: Min guard;Rolling walker General ADL Comments: Pt demonstrating good functional performance post surgery. Feel pt will do well with time. Pt performing LB ADLs and functional mobility with Min Guard A. Pt requesting education on donning/doffing socks with AE. Feel pt would benefit from AE due to pain and body habitus.      Vision Baseline Vision/History: Wears glasses Wears Glasses: Reading only Patient Visual Report: No change from baseline       Perception     Praxis      Pertinent Vitals/Pain Pain Assessment: 0-10 Pain Score: 3  Pain Location: L knee  Pain Descriptors / Indicators: Aching;Operative site guarding Pain Intervention(s): Monitored during session;Repositioned;Ice applied     Hand Dominance Right   Extremity/Trunk  Assessment Upper Extremity Assessment Upper Extremity Assessment: Overall WFL for tasks assessed   Lower Extremity Assessment Lower Extremity Assessment: Defer to PT evaluation   Cervical / Trunk Assessment Cervical / Trunk Assessment: Normal   Communication  Communication Communication: No difficulties   Cognition Arousal/Alertness: Awake/alert Behavior During Therapy: WFL for tasks assessed/performed Overall Cognitive Status: Within Functional Limits for tasks assessed                                     General Comments  ace wrap in place    Exercises     Shoulder Instructions      Home Living Family/patient expects to be discharged to:: Private residence Living Arrangements: Alone Available Help at Discharge: Family;Available 24 hours/day (Sister in law here to tuesday; then brother coming) Type of Home: House Home Access: Stairs to enter [employer] of Steps: 4 Entrance Stairs-Rails: [place]: One level     Bathroom Shower/Tub: Occupational psychologist: [place]: Environmental consultant - 2 wheels;Cane - single point;Bedside commode;Grab bars - tub/shower;Adaptive equipment Adaptive Equipment: Long-handled sponge;Reacher;Sock aid;Other (Comment) (Leg lifter)        Prior Functioning/Environment Level of Independence: Independent        Comments: ADLs, IADLs, driving, has puppies,         OT Problem List: Decreased range of motion;Decreased activity tolerance;Impaired balance (sitting and/or standing);Decreased knowledge of use of DME or AE;Decreased knowledge of precautions;Pain      OT Treatment/Interventions: Self-care/ADL training;Therapeutic exercise;Energy conservation;DME and/or AE instruction;Therapeutic activities;Patient/family education    OT Goals(Current goals can be found in the care plan section) Acute Rehab OT Goals Patient Stated Goal: to go home  OT Goal Formulation: With patient Time For Goal Achievement: 08/29/16 Potential to Achieve Goals: Good ADL Goals Pt Will Perform Lower Body Dressing: with set-up;with supervision;sit to/from stand;with adaptive equipment Pt Will Transfer to Toilet: with set-up;with supervision;bedside  commode;ambulating Pt Will Perform Toileting - Clothing Manipulation and hygiene: with set-up;with supervision;sit to/from stand Pt Will Perform Tub/Shower Transfer: Shower transfer;with supervision;with set-up;shower seat;ambulating;rolling walker  OT Frequency: Min 2X/week   Barriers to D/C:            Co-evaluation              AM-PAC PT "6 Clicks" Daily Activity     Outcome Measure Help from another person eating meals?: None Help from another person taking care of personal grooming?: None Help from another person toileting, which includes using toliet, bedpan, or urinal?: A Little Help from another person bathing (including washing, rinsing, drying)?: A Little Help from another person to put on and taking off regular upper body clothing?: None Help from another person to put on and taking off regular lower body clothing?: A Little 6 Click Score: 21   End of Session Equipment Utilized During Treatment: Rolling walker Nurse Communication: Mobility status  Activity Tolerance: Patient tolerated treatment well Patient left: in chair;with call bell/phone within reach  OT Visit Diagnosis: Unsteadiness on feet (R26.81);Other abnormalities of gait and mobility (R26.89);Pain Pain - Right/Left: Left Pain - part of body: Knee                Time: 6045-4098 OT Time Calculation (min): 25 min Charges:  OT General Charges $OT Visit: 1 Procedure OT Evaluation $OT Eval Low Complexity: 1 Procedure OT Treatments $Self Care/Home Management : 8-22 mins  G-Codes:     C[place], OTR/L Acute Rehab Pager: [phone] Office: [place] 08/15/2016, 9:35 AM

## 2016-08-15 NOTE — Progress Notes (Signed)
Physical Therapy Treatment Patient Details Name: Laura Bean MRN: [id number] DOB: Sep 08, 1950 Today's Date: 08/15/2016    History of Present Illness Pt is a 66 y/o female s/p elective L TKA. PMH includes arthritis and HTN.     PT Comments    Pt performed well during am tx and shows progression toward goals. Pt able to increase ambulation distance, min guard due to LLE instability during stance phase and weight shift. No buckling. Pt required min cueing during gait to increase step length on RLE and dorsiflexion on LLE to assist in regulating sequencing pattern. Pt performed HEP with min cueing for proper form and velocity. Plan of care remains appropriate with focus of pm tx to achieve stair navigation for functional independence.    Follow Up Recommendations  DC plan and follow up therapy as arranged by surgeon;Supervision/Assistance - 24 hour     Equipment Recommendations  None recommended by PT    Recommendations for Other Services       Precautions / Restrictions Precautions Precautions: Knee Precaution Booklet Issued: No Precaution Comments: Reviewed resting with knee in ex and elevating at ankle Restrictions Weight Bearing Restrictions: Yes LLE Weight Bearing: Weight bearing as tolerated    Mobility  Bed Mobility               General bed mobility comments: Pt in chair upon arrival.  Transfers Overall transfer level: Needs assistance Equipment used: Rolling walker (2 wheeled) Transfers: Sit to/from Stand Sit to Stand: Min guard         General transfer comment: Min guard for stability, no cues needed.  Ambulation/Gait Ambulation/Gait assistance: Min guard Ambulation Distance (Feet): 195 Feet Assistive device: Rolling walker (2 wheeled) Gait Pattern/deviations: Step-through pattern;Decreased step length - right;Decreased stance time - left;Decreased weight shift to left;Antalgic Gait velocity: Decreased   General Gait Details: Pt presents with above  deficits requiring vc's throughout ambulation to increase step length with RLE to assist increasing stance time on LLE. Pt also presented with slight instability when in stance phase on LLE.    Stairs            Wheelchair Mobility    Modified Rankin (Stroke Patients Only)       Balance Overall balance assessment: Needs assistance Sitting-balance support: No upper extremity supported;Feet supported Sitting balance-Leahy Scale: Good Sitting balance - Comments: Able to sit EOB no UE support while taking ROM measurments.    Standing balance support: Bilateral upper extremity supported;During functional activity Standing balance-Leahy Scale: Fair Standing balance comment: Pt able to static stand no UE support during RW adjustments.                             Cognition Arousal/Alertness: Awake/alert Behavior During Therapy: WFL for tasks assessed/performed Overall Cognitive Status: Within Functional Limits for tasks assessed                                 General Comments: Pt awake in chair ready to perform therapy.      Exercises Total Joint Exercises Ankle Circles/Pumps: AROM;Both;20 reps;Supine Quad Sets: AROM;Left;10 reps;Supine Heel Slides: AROM;Left;10 reps;Supine Hip ABduction/ADduction: AROM;Both;10 reps;Supine Straight Leg Raises: AAROM;Left;10 reps;Supine Knee Flexion: AROM;Left;5 reps;Seated Goniometric ROM: L knee at 5-90    General Comments        Pertinent Vitals/Pain Pain Assessment: 0-10 Pain Score: 2  Pain Location: L knee  Pain  Descriptors / Indicators: Aching;Constant;Operative site guarding Pain Intervention(s): Monitored during session;Ice applied    Home Living                      Prior Function            PT Goals (current goals can now be found in the care plan section) Acute Rehab PT Goals Patient Stated Goal: to go home  PT Goal Formulation: With patient Potential to Achieve Goals:  Good Progress towards PT goals: Progressing toward goals    Frequency    7X/week      PT Plan      Co-evaluation              AM-PAC PT "6 Clicks" Daily Activity  Outcome Measure  Difficulty turning over in bed (including adjusting bedclothes, sheets and blankets)?: A Little Difficulty moving from lying on back to sitting on the side of the bed? : A Little Difficulty sitting down on and standing up from a chair with arms (e.g., wheelchair, bedside commode, etc,.)?: A Lot Help needed moving to and from a bed to chair (including a wheelchair)?: A Little Help needed walking in hospital room?: A Little Help needed climbing 3-5 steps with a railing? : A Little 6 Click Score: 17    End of Session Equipment Utilized During Treatment: Gait belt Activity Tolerance: Patient tolerated treatment well Patient left: in bed;with call bell/phone within reach;with family/visitor present Nurse Communication: Mobility status PT Visit Diagnosis: Other abnormalities of gait and mobility (R26.89);Pain Pain - Right/Left: Left Pain - part of body: Knee     Time: 5379-4327 PT Time Calculation (min) (ACUTE ONLY): 22 min  Charges:  $Gait Training: 8-22 mins                    G CodesRandalyn Rhea, [place] 08/15/2016, 2:25 PM

## 2016-08-15 NOTE — Progress Notes (Signed)
Transitions of Care - Pain Medication Review for Discharge to Home  Patient Laura Bean is a 66 YO F hospitalized on 08/14/16, s/p elective L TKA.   Assessment/Recommendation:  Patient with moderate pain which is relieved by oxycodone 10 mg (brings pain score down ~30% (6 to 4). Pain seems to be currently well controlled after surgery. Currently not taking PRN APAP. On continuous multimodal pain relief w/ scheduled celecoxib and gabapentin. States that gabapentin "really helps" her pain.   At discharge could consider:   1. Continue celecoxib 200 mg bid x 7 days, reassess use at f/u 2. Continue gabapentin 300 mg TID until follow up, taper when appropriate by decreasing total daily dose by 300 mg every 2 days.  3. Initiate scheduled acetaminophen 650 mg po QID x 7 days, then taper to PRN.  4. Give Rx for oxycodone 10 mg, 1-2 tabs po q4h prn severe breakthrough pain, qty 80 tabs with the following taper:   Days 1-4: 2 tabs x 5 doses  Days 5-7: 2 tabs x 4 doses  Days 8-10: 2 tabs x 3 doses 5. Discontinue Tylenol PM while on other sedating medications.  ____________________________________________________________ Prior to admission  PTA, patient is not on any opioid medications, confirmed by [place] CSRS check on 08/15/16.  Pertinent co-morbid conditions include arthritis and HTN.  CrCl ~71.6 ml/min (using Cockcroft-Gault equation).  Other pertinent PTA medications include acetaminophen-diphenhydramine 500-25 qhs, wellbutrin xl 300 mg daily, naproxen 500 mg po BID w/ meal .   ------------------------------------------------------------------------------ During Admission  Today, patient reports 2/10 for L knee pain upon questioning, ~30 minutes after medication administered.   Scheduled Opioids:  -none  PRN Opioids:  -oxycodone IR 5-10 mg po q3h prn breakthrough pain (taking x four 10 mg doses) -hydromorphone 0.5-1 mg IV q2h prn severe pain, unresolved breakthrough pain. (not yet  taking)  Mg of Morphine Equivalents (past 24 hours): 60 mg  Non-opioid analgesics:  -celecoxib 200 mg po BID  -gabapentin 300 mg po TID -acetaminophen 650 mg po q6h prn mild pain (not yet taking) ____________________________________________________________ Assessment of Pain and Goals  Standing makes patient's pain worse.   Rest and medications makes patient's pain better.   Hopes to be able to be off of medications and able to move around to drive within the next few weeks.  ____________________________________________________________  Counseling provided:  -APAP max dose and sources -Opioid side effects -Opioid proper disposal -Pain goals and expectations

## 2016-08-16 LAB — CBC
HEMATOCRIT: 32.8 % — AB (ref 36.0–46.0)
Hemoglobin: 10.5 g/dL — ABNORMAL LOW (ref 12.0–15.0)
MCH: 29.6 pg (ref 26.0–34.0)
MCHC: 32 g/dL (ref 30.0–36.0)
MCV: 92.4 fL (ref 78.0–100.0)
PLATELETS: 244 10*3/uL (ref 150–400)
RBC: 3.55 MIL/uL — ABNORMAL LOW (ref 3.87–5.11)
RDW: 13 % (ref 11.5–15.5)
WBC: 9.8 10*3/uL (ref 4.0–10.5)

## 2016-08-16 NOTE — Discharge Summary (Signed)
Patient ID: Laura Bean MRN: [id number] DOB/AGE: 66-13-1952 66 y.o.  Admit date: 08/14/2016 Discharge date: 08/16/2016  Admission Diagnoses:  Principal Problem:   Degenerative arthritis of left knee Active Problems:   Primary osteoarthritis of left knee   Discharge Diagnoses:  Same  Past Medical History:  Diagnosis Date  . Arthritis   . Family history of adverse reaction to anesthesia    ? father confusion with anesthesia when older  . GERD (gastroesophageal reflux disease)    occ  . History of kidney stones   . Hypertension     Surgeries: Procedure(s): TOTAL KNEE ARTHROPLASTY on 08/14/2016   Consultants:   Discharged Condition: Improved  Hospital Course: Laura Bean is an 66 y.o. female who was admitted 08/14/2016 for operative treatment ofDegenerative arthritis of left knee. Patient has severe unremitting pain that affects sleep, daily activities, and work/hobbies. After pre-op clearance the patient was taken to the operating room on 08/14/2016 and underwent  Procedure(s): TOTAL KNEE ARTHROPLASTY.    Patient was given perioperative antibiotics: Anti-infectives    Start     Dose/Rate Route Frequency Ordered Stop   08/14/16 0800  cefUROXime (ZINACEF) injection  Status:  Discontinued       As needed 08/14/16 0800 08/14/16 0915   08/14/16 0700  ceFAZolin (ANCEF) IVPB 2g/100 mL premix     2 g 200 mL/hr over 30 Minutes Intravenous To [facility] 08/11/16 1327 08/14/16 0746       Patient was given sequential compression devices, early ambulation, and chemoprophylaxis to prevent DVT.  Patient benefited maximally from hospital stay and there were no complications.    Recent vital signs: Patient Vitals for the past 24 hrs:  BP Temp Temp src Pulse Resp SpO2 Height Weight  08/16/16 0427 (!) 100/51 98.4 F (36.9 C) Oral 73 16 99 % - -  08/15/16 1912 119/61 98.4 F (36.9 C) Oral 85 - 98 % - -  08/15/16 1200 - - - - - - 5\' 3"  (1.6 m) 85.3 kg (188 lb)     Recent  laboratory studies:  Recent Labs  08/15/16 0416 08/16/16 0618  WBC 8.9 9.8  HGB 10.2* 10.5*  HCT 31.9* 32.8*  PLT 230 244  NA 137  --   K 5.0  --   CL 104  --   CO2 29  --   BUN 9  --   CREATININE 0.77  --   GLUCOSE 139*  --   CALCIUM 8.6*  --      Discharge Medications:   Allergies as of 08/16/2016      Reactions   Lisinopril Cough      Medication List    STOP taking these medications   naproxen 500 MG tablet Commonly known as:  NAPROSYN     TAKE these medications   aspirin EC 325 MG tablet Take 1 tablet (325 mg total) by mouth 2 (two) times daily.   buPROPion 300 MG 24 hr tablet Commonly known as:  WELLBUTRIN XL Take 300 mg by mouth daily.   calcium carbonate 1500 (600 Ca) MG Tabs tablet Commonly known as:  OSCAL Take 1,200 mg by mouth at bedtime.   cholecalciferol 1000 units tablet Commonly known as:  VITAMIN D Take 1,000 Units by mouth every evening.   diphenhydramine-acetaminophen 25-500 MG Tabs tablet Commonly known as:  TYLENOL PM Take 1 tablet by mouth at bedtime.   Glucosamine 500 MG Caps Take 500 mg by mouth at bedtime.   oxyCODONE-acetaminophen 5-325 MG tablet Commonly  known as:  ROXICET Take 1 tablet by mouth every 4 (four) hours as needed.   tiZANidine 2 MG tablet Commonly known as:  ZANAFLEX Take 1 tablet (2 mg total) by mouth every 6 (six) hours as needed for muscle spasms.   valsartan-hydrochlorothiazide 80-12.5 MG tablet Commonly known as:  DIOVAN-HCT Take 1 tablet by mouth at bedtime.   vitamin B-12 1000 MCG tablet Commonly known as:  CYANOCOBALAMIN Take 1,000 mcg by mouth 2 (two) times a week. Monday & Friday            Durable Medical Equipment        Start     Ordered   08/14/16 1115  DME Walker rolling  Once    Question:  Patient needs a walker to treat with the following condition  Answer:  Status post total left knee replacement   08/14/16 1115   08/14/16 1115  DME 3 n 1  Once     08/14/16 1115   08/14/16  1115  DME Bedside commode  Once    Question:  Patient needs a bedside commode to treat with the following condition  Answer:  Status post total left knee replacement   08/14/16 1115      Diagnostic Studies: Dg Chest 2 View  Result Date: 08/08/2016 CLINICAL DATA:  Preoperative evaluation for LEFT knee replacement EXAM: CHEST  2 VIEW COMPARISON:  None FINDINGS: Normal heart size, mediastinal contours, and pulmonary vascularity. Lungs clear. No pleural effusion or pneumothorax. Bones unremarkable. LEFT upper quadrant surgical clips noted. IMPRESSION: No acute abnormalities. Electronically Signed   By: Lavonia Dana M.D.   On: 08/08/2016 18:27    Disposition:   Discharge Instructions    Call MD / Call 911    Complete by:  As directed    If you experience chest pain or shortness of breath, CALL 911 and be transported to the hospital emergency room.  If you develope a fever above 101 F, pus (white drainage) or increased drainage or redness at the wound, or calf pain, call your surgeon's office.   Constipation Prevention    Complete by:  As directed    Drink plenty of fluids.  Prune juice may be helpful.  You may use a stool softener, such as Colace (over the counter) 100 mg twice a day.  Use MiraLax (over the counter) for constipation as needed.   Diet - low sodium heart healthy    Complete by:  As directed    Driving restrictions    Complete by:  As directed    No driving for 2 weeks   Increase activity slowly as tolerated    Complete by:  As directed    Patient may shower    Complete by:  As directed    You may shower without a dressing once there is no drainage.  Do not wash over the wound.  If drainage remains, cover wound with plastic wrap and then shower.      Follow-up Information    Frederik Pear, MD Follow up in 2 week(s).   Specialty:  Orthopedic Surgery Contact information: [place] [address] [phone]        [facility], [place] Follow up.   Contact  information: [address] [place] [address] [phone]        [facility], [facility] Follow up.   Specialty:  [place] Why:  A representative from [facility] will contact you to arrange start date and time  for your therapy. Contact information: [place] [address] [phone]            Signed: Hardin Negus, Maiya Kates R 08/16/2016, 7:48 AM

## 2016-08-16 NOTE — Progress Notes (Signed)
Physical Therapy Treatment Patient Details Name: Laura Bean MRN: [id number] DOB: Jan 24, 1950 Today's Date: 08/16/2016    History of Present Illness Pt is a 66 y/o female s/p elective L TKA. PMH includes arthritis and HTN.     PT Comments    Pt tolerated Tx well with some limitations to pain. Pt Mod I for transfers and Min guard for gait with minimal cues. Next TX pt to stair train and continue with exercise for LE strengthening while still in acute care setting.   Follow Up Recommendations  DC plan and follow up therapy as arranged by surgeon;Supervision/Assistance - 24 hour     Equipment Recommendations  None recommended by PT    Recommendations for Other Services       Precautions / Restrictions Precautions Precautions: Knee Precaution Booklet Issued: No Precaution Comments: Reviewed resting with knee in ex and elevating at ankle Restrictions Weight Bearing Restrictions: Yes LLE Weight Bearing: Weight bearing as tolerated    Mobility  Bed Mobility           General bed mobility comments: Pt in reclining chair upon arrival  Transfers Overall transfer level: Modified independent Equipment used: Rolling walker (2 wheeled) Transfers: Sit to/from Stand Sit to Stand: Modified independent (Device/Increase time)         General transfer comment: Increased time with RW, no cues or physical assistance required  Ambulation/Gait Ambulation/Gait assistance: Min guard Ambulation Distance (Feet): 550 Feet Assistive device: Rolling walker (2 wheeled) Gait Pattern/deviations: Step-to pattern;Decreased stance time - left;Decreased step length - left;Decreased weight shift to left;Trunk flexed Gait velocity: Decreased   General Gait Details: Pt required VC's for posture, to increase step length with LLE, practice standing weight shift to increase WB on LLE. Pt did well with heel strike/toe off and RW safety    Stairs            Wheelchair Mobility    Modified  Rankin (Stroke Patients Only)       Balance Overall balance assessment: Needs assistance Sitting-balance support: No upper extremity supported;Feet supported Sitting balance-Leahy Scale: Good Sitting balance - Comments: Able to sit EOB no UE support while taking ROM measurments.    Standing balance support: Bilateral upper extremity supported;During functional activity Standing balance-Leahy Scale: Fair Standing balance comment: No UE with static standing, UE support required during functional activity                             Cognition Arousal/Alertness: Awake/alert Behavior During Therapy: WFL for tasks assessed/performed Overall Cognitive Status: Within Functional Limits for tasks assessed                                        Exercises Total Joint Exercises Ankle Circles/Pumps: AROM;Both;20 reps;Seated Quad Sets: AROM;10 reps;Both;Seated Towel Squeeze: AROM;Both;10 reps;Seated Short Arc Quad: AROM;Left;10 reps;Seated Hip ABduction/ADduction: AROM;Both;10 reps;Seated Goniometric ROM: L Knee 5-95 degrees    General Comments        Pertinent Vitals/Pain Pain Assessment: 0-10 Pain Score: 6  Faces Pain Scale: Hurts a little bit Pain Location: L knee  Pain Descriptors / Indicators: Aching;Operative site guarding;Tightness Pain Intervention(s): Limited activity within patient's tolerance;Monitored during session;RN gave pain meds during session;Ice applied    Home Living  Prior Function            PT Goals (current goals can now be found in the care plan section) Acute Rehab PT Goals Patient Stated Goal: to go home today  PT Goal Formulation: With patient Potential to Achieve Goals: Good Progress towards PT goals: Progressing toward goals    Frequency    7X/week      PT Plan Current plan remains appropriate    Co-evaluation              AM-PAC PT "6 Clicks" Daily Activity  Outcome  Measure  Difficulty turning over in bed (including adjusting bedclothes, sheets and blankets)?: A Little Difficulty moving from lying on back to sitting on the side of the bed? : A Little Difficulty sitting down on and standing up from a chair with arms (e.g., wheelchair, bedside commode, etc,.)?: A Little Help needed moving to and from a bed to chair (including a wheelchair)?: A Little Help needed walking in hospital room?: A Little Help needed climbing 3-5 steps with a railing? : A Little 6 Click Score: 18    End of Session Equipment Utilized During Treatment: Gait belt Activity Tolerance: Patient tolerated treatment well;Patient limited by pain Patient left: in chair;with call bell/phone within reach Nurse Communication: Mobility status PT Visit Diagnosis: Other abnormalities of gait and mobility (R26.89);Pain Pain - Right/Left: Left Pain - part of body: Knee     Time: 0931-1003 PT Time Calculation (min) (ACUTE ONLY): 32 min  Charges:    $ Gait Training $Therapeutic Exercise                   G Codes:       Laura Bean, SPTA [id number]    Bodin Gorka 08/16/2016, 10:36 AM

## 2016-08-16 NOTE — Progress Notes (Signed)
Occupational Therapy Treatment Patient Details Name: Laura Bean MRN: [id number] DOB: 08-23-1950 Today's Date: 08/16/2016    History of present illness Pt is a 66 y/o female s/p elective L TKA. PMH includes arthritis and HTN.    OT comments  Provided education for AE to increase safety and independence with ADLs. Pt demonstrated good understanding and performed dressing, grooming, and toileting with Min Guard A for safety in standing. All acute OT needs met and answered pt questions in preparation for dc. Will sign off.    Follow Up Recommendations  DC plan and follow up therapy as arranged by surgeon;Supervision/Assistance - 24 hour    Equipment Recommendations  None recommended by OT    Recommendations for Other Services PT consult    Precautions / Restrictions Precautions Precautions: Knee Precaution Booklet Issued: No Precaution Comments: Reviewed resting with knee in ex and elevating at ankle Restrictions Weight Bearing Restrictions: Yes LLE Weight Bearing: Weight bearing as tolerated       Mobility Bed Mobility Overal bed mobility: Modified Independent Bed Mobility: Supine to Sit     Supine to sit: Supervision     General bed mobility comments: No cues needed, use of bed rails to elevate upper trunk.   Transfers Overall transfer level: Modified independent Equipment used: Rolling walker (2 wheeled)             General transfer comment: Increased time and use of RW    Balance Overall balance assessment: Needs assistance Sitting-balance support: No upper extremity supported;Feet supported Sitting balance-Leahy Scale: Good Sitting balance - Comments: Able to sit EOB no UE support while taking ROM measurments.    Standing balance support: During functional activity;No upper extremity supported Standing balance-Leahy Scale: Fair Standing balance comment: Pt able to static stand no UE support during RW adjustments.                             ADL either performed or assessed with clinical judgement   ADL Overall ADL's : Needs assistance/impaired     Grooming: Oral care;Supervision/safety;Standing           Upper Body Dressing : Set up;Sitting Upper Body Dressing Details (indicate cue type and reason): Pt donned bra and shirt with set up at EOB Lower Body Dressing: Min guard;With adaptive equipment;Sit to/from stand Lower Body Dressing Details (indicate cue type and reason): Educated pt on AE for donning/doffing socks and shoes. Min Guard for Marketing executive: Min guard;Ambulation;RW;BSC           Functional mobility during ADLs: Rolling walker;Supervision/safety General ADL Comments: Provided education on AE for LB ADLs. Pt demonstrating understanding. All acute OT needs met.     Vision       Perception     Praxis      Cognition Arousal/Alertness: Awake/alert Behavior During Therapy: WFL for tasks assessed/performed Overall Cognitive Status: Within Functional Limits for tasks assessed                                          Exercises     Shoulder Instructions       General Comments      Pertinent Vitals/ Pain       Pain Assessment: Faces Faces Pain Scale: Hurts a little bit Pain Location: L knee  Pain Descriptors / Indicators: Aching;Constant;Operative site  guarding  Home Living                                          Prior Functioning/Environment              Frequency  Min 2X/week        Progress Toward Goals  OT Goals(current goals can now be found in the care plan section)  Progress towards OT goals: Progressing toward goals  Acute Rehab OT Goals Patient Stated Goal: to go home  OT Goal Formulation: With patient Time For Goal Achievement: 08/29/16 Potential to Achieve Goals: Good ADL Goals Pt Will Perform Lower Body Dressing: with set-up;with supervision;sit to/from stand;with adaptive equipment Pt Will  Transfer to Toilet: with set-up;with supervision;bedside commode;ambulating Pt Will Perform Toileting - Clothing Manipulation and hygiene: with set-up;with supervision;sit to/from stand Pt Will Perform Tub/Shower Transfer: Shower transfer;with supervision;with set-up;shower seat;ambulating;rolling walker  Plan Discharge plan remains appropriate    Co-evaluation                 AM-PAC PT "6 Clicks" Daily Activity     Outcome Measure   Help from another person eating meals?: None Help from another person taking care of personal grooming?: None Help from another person toileting, which includes using toliet, bedpan, or urinal?: A Little Help from another person bathing (including washing, rinsing, drying)?: A Little Help from another person to put on and taking off regular upper body clothing?: None Help from another person to put on and taking off regular lower body clothing?: A Little 6 Click Score: 21    End of Session Equipment Utilized During Treatment: Rolling walker  OT Visit Diagnosis: Unsteadiness on feet (R26.81);Other abnormalities of gait and mobility (R26.89);Pain Pain - Right/Left: Left Pain - part of body: Knee   Activity Tolerance Patient tolerated treatment well   Patient Left in chair;with call bell/phone within reach   Nurse Communication Mobility status        Time: 4128-2081 OT Time Calculation (min): 18 min  Charges: OT General Charges $OT Visit: 1 Procedure OT Treatments $Self Care/Home Management : 8-22 mins  [place], OTR/L Acute Rehab Pager: [phone] Office: [place] 08/16/2016, 9:50 AM

## 2016-08-16 NOTE — Progress Notes (Signed)
PATIENT ID: Laura Bean  MRN: [id number]  DOB/AGE:  October 28, 1950 / 66 y.o.  2 Days Post-Op Procedure(s) (LRB): TOTAL KNEE ARTHROPLASTY (Left)    PROGRESS NOTE Subjective: Patient is alert, oriented, no Nausea, no Vomiting, yes passing gas. Taking PO well. Denies SOB, Chest or Calf Pain. Using Incentive Spirometer, PAS in place. Ambulate WBAT with pt walking 195 ft with therapy, Patient reports pain as mild.    Objective: Vital signs in last 24 hours: Vitals:   08/15/16 0454 08/15/16 1200 08/15/16 1912 08/16/16 0427  BP: 95/66  119/61 (!) 100/51  Pulse: 68  85 73  Resp: 16   16  Temp: 98.4 F (36.9 C)  98.4 F (36.9 C) 98.4 F (36.9 C)  TempSrc: Oral  Oral Oral  SpO2: 96%  98% 99%  Weight:  85.3 kg (188 lb)    Height:  5\' 3"  (1.6 m)        Intake/Output from previous day: I/O last 3 completed shifts: In: 4031.3 [P.O.:600; I.V.:3431.3] Out: 0    Intake/Output this shift: No intake/output data recorded.   LABORATORY DATA:  Recent Labs  08/15/16 0416 08/16/16 0618  WBC 8.9 9.8  HGB 10.2* 10.5*  HCT 31.9* 32.8*  PLT 230 244  NA 137  --   K 5.0  --   CL 104  --   CO2 29  --   BUN 9  --   CREATININE 0.77  --   GLUCOSE 139*  --   CALCIUM 8.6*  --     Examination: Neurologically intact Neurovascular intact Sensation intact distally Intact pulses distally Dorsiflexion/Plantar flexion intact Incision: dressing C/D/I No cellulitis present Compartment soft}  Assessment:   2 Days Post-Op Procedure(s) (LRB): TOTAL KNEE ARTHROPLASTY (Left) ADDITIONAL DIAGNOSIS: Expected Acute Blood Loss Anemia, Hypertension  Plan: PT/OT WBAT, AROM and PROM  DVT Prophylaxis:  SCDx72hrs, ASA 325 mg BID x 2 weeks DISCHARGE PLAN: Home DISCHARGE NEEDS: HHPT, Walker and 3-in-1 comode seat     Tavyn Kurka R 08/16/2016, 7:46 AM

## 2016-08-16 NOTE — Progress Notes (Signed)
Pt discharge education and instructions completed with pt; pt voices understanding and denies any questions. Pt IV removed; pt discharge home with family to transport her home. Pt left knee compression dsg remains clean, dry and intact with no stain or drainage noted. Pt handed her prescriptions aspirin, Zanaflex and Roxicet. Pt transported off unit via wheelchair with belongings to the side. Delia Heady RN

## 2016-08-17 DIAGNOSIS — Z471 Aftercare following joint replacement surgery: Secondary | ICD-10-CM | POA: Diagnosis not present

## 2016-08-17 DIAGNOSIS — Z96652 Presence of left artificial knee joint: Secondary | ICD-10-CM | POA: Diagnosis not present

## 2016-08-17 DIAGNOSIS — Z87891 Personal history of nicotine dependence: Secondary | ICD-10-CM | POA: Diagnosis not present

## 2016-08-17 DIAGNOSIS — I10 Essential (primary) hypertension: Secondary | ICD-10-CM | POA: Diagnosis not present

## 2016-08-21 DIAGNOSIS — Z471 Aftercare following joint replacement surgery: Secondary | ICD-10-CM | POA: Diagnosis not present

## 2016-08-21 DIAGNOSIS — Z87891 Personal history of nicotine dependence: Secondary | ICD-10-CM | POA: Diagnosis not present

## 2016-08-21 DIAGNOSIS — Z96652 Presence of left artificial knee joint: Secondary | ICD-10-CM | POA: Diagnosis not present

## 2016-08-21 DIAGNOSIS — I10 Essential (primary) hypertension: Secondary | ICD-10-CM | POA: Diagnosis not present

## 2016-08-23 DIAGNOSIS — Z471 Aftercare following joint replacement surgery: Secondary | ICD-10-CM | POA: Diagnosis not present

## 2016-08-23 DIAGNOSIS — Z87891 Personal history of nicotine dependence: Secondary | ICD-10-CM | POA: Diagnosis not present

## 2016-08-23 DIAGNOSIS — I10 Essential (primary) hypertension: Secondary | ICD-10-CM | POA: Diagnosis not present

## 2016-08-23 DIAGNOSIS — Z96652 Presence of left artificial knee joint: Secondary | ICD-10-CM | POA: Diagnosis not present

## 2016-08-25 DIAGNOSIS — I10 Essential (primary) hypertension: Secondary | ICD-10-CM | POA: Diagnosis not present

## 2016-08-25 DIAGNOSIS — Z96652 Presence of left artificial knee joint: Secondary | ICD-10-CM | POA: Diagnosis not present

## 2016-08-25 DIAGNOSIS — Z87891 Personal history of nicotine dependence: Secondary | ICD-10-CM | POA: Diagnosis not present

## 2016-08-25 DIAGNOSIS — Z471 Aftercare following joint replacement surgery: Secondary | ICD-10-CM | POA: Diagnosis not present

## 2016-08-28 DIAGNOSIS — Z96652 Presence of left artificial knee joint: Secondary | ICD-10-CM | POA: Diagnosis not present

## 2016-08-28 DIAGNOSIS — Z87891 Personal history of nicotine dependence: Secondary | ICD-10-CM | POA: Diagnosis not present

## 2016-08-28 DIAGNOSIS — I10 Essential (primary) hypertension: Secondary | ICD-10-CM | POA: Diagnosis not present

## 2016-08-28 DIAGNOSIS — Z471 Aftercare following joint replacement surgery: Secondary | ICD-10-CM | POA: Diagnosis not present

## 2016-08-29 DIAGNOSIS — Z96652 Presence of left artificial knee joint: Secondary | ICD-10-CM | POA: Diagnosis not present

## 2016-08-29 DIAGNOSIS — Z471 Aftercare following joint replacement surgery: Secondary | ICD-10-CM | POA: Diagnosis not present

## 2016-08-29 DIAGNOSIS — M1712 Unilateral primary osteoarthritis, left knee: Secondary | ICD-10-CM | POA: Diagnosis not present

## 2016-08-30 DIAGNOSIS — Z96652 Presence of left artificial knee joint: Secondary | ICD-10-CM | POA: Diagnosis not present

## 2016-08-30 DIAGNOSIS — Z471 Aftercare following joint replacement surgery: Secondary | ICD-10-CM | POA: Diagnosis not present

## 2016-08-30 DIAGNOSIS — Z87891 Personal history of nicotine dependence: Secondary | ICD-10-CM | POA: Diagnosis not present

## 2016-08-30 DIAGNOSIS — I10 Essential (primary) hypertension: Secondary | ICD-10-CM | POA: Diagnosis not present

## 2016-09-01 DIAGNOSIS — Z96652 Presence of left artificial knee joint: Secondary | ICD-10-CM | POA: Diagnosis not present

## 2016-09-01 DIAGNOSIS — Z87891 Personal history of nicotine dependence: Secondary | ICD-10-CM | POA: Diagnosis not present

## 2016-09-01 DIAGNOSIS — I10 Essential (primary) hypertension: Secondary | ICD-10-CM | POA: Diagnosis not present

## 2016-09-01 DIAGNOSIS — Z471 Aftercare following joint replacement surgery: Secondary | ICD-10-CM | POA: Diagnosis not present

## 2016-09-06 DIAGNOSIS — I10 Essential (primary) hypertension: Secondary | ICD-10-CM | POA: Diagnosis not present

## 2016-09-06 DIAGNOSIS — Z87891 Personal history of nicotine dependence: Secondary | ICD-10-CM | POA: Diagnosis not present

## 2016-09-06 DIAGNOSIS — Z471 Aftercare following joint replacement surgery: Secondary | ICD-10-CM | POA: Diagnosis not present

## 2016-09-06 DIAGNOSIS — Z96652 Presence of left artificial knee joint: Secondary | ICD-10-CM | POA: Diagnosis not present

## 2016-09-08 DIAGNOSIS — I10 Essential (primary) hypertension: Secondary | ICD-10-CM | POA: Diagnosis not present

## 2016-09-08 DIAGNOSIS — Z87891 Personal history of nicotine dependence: Secondary | ICD-10-CM | POA: Diagnosis not present

## 2016-09-08 DIAGNOSIS — Z471 Aftercare following joint replacement surgery: Secondary | ICD-10-CM | POA: Diagnosis not present

## 2016-09-08 DIAGNOSIS — Z96652 Presence of left artificial knee joint: Secondary | ICD-10-CM | POA: Diagnosis not present

## 2016-09-11 DIAGNOSIS — M25562 Pain in left knee: Secondary | ICD-10-CM | POA: Diagnosis not present

## 2016-09-11 DIAGNOSIS — Z96652 Presence of left artificial knee joint: Secondary | ICD-10-CM | POA: Diagnosis not present

## 2016-09-11 DIAGNOSIS — M25662 Stiffness of left knee, not elsewhere classified: Secondary | ICD-10-CM | POA: Diagnosis not present

## 2016-09-12 DIAGNOSIS — R69 Illness, unspecified: Secondary | ICD-10-CM | POA: Diagnosis not present

## 2016-09-13 DIAGNOSIS — Z96652 Presence of left artificial knee joint: Secondary | ICD-10-CM | POA: Diagnosis not present

## 2016-09-13 DIAGNOSIS — M25562 Pain in left knee: Secondary | ICD-10-CM | POA: Diagnosis not present

## 2016-09-13 DIAGNOSIS — M25662 Stiffness of left knee, not elsewhere classified: Secondary | ICD-10-CM | POA: Diagnosis not present

## 2016-09-19 DIAGNOSIS — M25662 Stiffness of left knee, not elsewhere classified: Secondary | ICD-10-CM | POA: Diagnosis not present

## 2016-09-19 DIAGNOSIS — Z96652 Presence of left artificial knee joint: Secondary | ICD-10-CM | POA: Diagnosis not present

## 2016-09-19 DIAGNOSIS — M25562 Pain in left knee: Secondary | ICD-10-CM | POA: Diagnosis not present

## 2016-09-19 DIAGNOSIS — M25622 Stiffness of left elbow, not elsewhere classified: Secondary | ICD-10-CM | POA: Diagnosis not present

## 2016-09-20 DIAGNOSIS — Z23 Encounter for immunization: Secondary | ICD-10-CM | POA: Diagnosis not present

## 2016-09-22 DIAGNOSIS — M25662 Stiffness of left knee, not elsewhere classified: Secondary | ICD-10-CM | POA: Diagnosis not present

## 2016-09-22 DIAGNOSIS — M25562 Pain in left knee: Secondary | ICD-10-CM | POA: Diagnosis not present

## 2016-09-22 DIAGNOSIS — Z96652 Presence of left artificial knee joint: Secondary | ICD-10-CM | POA: Diagnosis not present

## 2016-09-26 DIAGNOSIS — Z96652 Presence of left artificial knee joint: Secondary | ICD-10-CM | POA: Diagnosis not present

## 2016-09-26 DIAGNOSIS — M25562 Pain in left knee: Secondary | ICD-10-CM | POA: Diagnosis not present

## 2016-09-26 DIAGNOSIS — M25662 Stiffness of left knee, not elsewhere classified: Secondary | ICD-10-CM | POA: Diagnosis not present

## 2016-09-28 DIAGNOSIS — M25562 Pain in left knee: Secondary | ICD-10-CM | POA: Diagnosis not present

## 2016-09-28 DIAGNOSIS — Z96652 Presence of left artificial knee joint: Secondary | ICD-10-CM | POA: Diagnosis not present

## 2016-09-28 DIAGNOSIS — M25662 Stiffness of left knee, not elsewhere classified: Secondary | ICD-10-CM | POA: Diagnosis not present

## 2016-10-03 DIAGNOSIS — M25662 Stiffness of left knee, not elsewhere classified: Secondary | ICD-10-CM | POA: Diagnosis not present

## 2016-10-03 DIAGNOSIS — M25562 Pain in left knee: Secondary | ICD-10-CM | POA: Diagnosis not present

## 2016-10-03 DIAGNOSIS — Z96652 Presence of left artificial knee joint: Secondary | ICD-10-CM | POA: Diagnosis not present

## 2016-10-03 DIAGNOSIS — Z471 Aftercare following joint replacement surgery: Secondary | ICD-10-CM | POA: Diagnosis not present

## 2016-10-05 DIAGNOSIS — Z96652 Presence of left artificial knee joint: Secondary | ICD-10-CM | POA: Diagnosis not present

## 2016-10-05 DIAGNOSIS — M25562 Pain in left knee: Secondary | ICD-10-CM | POA: Diagnosis not present

## 2016-10-05 DIAGNOSIS — M25662 Stiffness of left knee, not elsewhere classified: Secondary | ICD-10-CM | POA: Diagnosis not present

## 2016-10-10 DIAGNOSIS — Z96652 Presence of left artificial knee joint: Secondary | ICD-10-CM | POA: Diagnosis not present

## 2016-10-10 DIAGNOSIS — M25662 Stiffness of left knee, not elsewhere classified: Secondary | ICD-10-CM | POA: Diagnosis not present

## 2016-10-10 DIAGNOSIS — M25562 Pain in left knee: Secondary | ICD-10-CM | POA: Diagnosis not present

## 2016-10-11 DIAGNOSIS — H0012 Chalazion right lower eyelid: Secondary | ICD-10-CM | POA: Diagnosis not present

## 2016-10-12 DIAGNOSIS — M25562 Pain in left knee: Secondary | ICD-10-CM | POA: Diagnosis not present

## 2016-10-12 DIAGNOSIS — Z96652 Presence of left artificial knee joint: Secondary | ICD-10-CM | POA: Diagnosis not present

## 2016-10-12 DIAGNOSIS — M25662 Stiffness of left knee, not elsewhere classified: Secondary | ICD-10-CM | POA: Diagnosis not present

## 2016-10-16 DIAGNOSIS — M25562 Pain in left knee: Secondary | ICD-10-CM | POA: Diagnosis not present

## 2016-10-16 DIAGNOSIS — Z96652 Presence of left artificial knee joint: Secondary | ICD-10-CM | POA: Diagnosis not present

## 2016-10-16 DIAGNOSIS — M25662 Stiffness of left knee, not elsewhere classified: Secondary | ICD-10-CM | POA: Diagnosis not present

## 2016-10-19 DIAGNOSIS — M25562 Pain in left knee: Secondary | ICD-10-CM | POA: Diagnosis not present

## 2016-10-19 DIAGNOSIS — Z96652 Presence of left artificial knee joint: Secondary | ICD-10-CM | POA: Diagnosis not present

## 2016-10-19 DIAGNOSIS — M25662 Stiffness of left knee, not elsewhere classified: Secondary | ICD-10-CM | POA: Diagnosis not present

## 2016-11-14 DIAGNOSIS — Z96652 Presence of left artificial knee joint: Secondary | ICD-10-CM | POA: Diagnosis not present

## 2016-11-14 DIAGNOSIS — M25562 Pain in left knee: Secondary | ICD-10-CM | POA: Diagnosis not present

## 2016-11-14 DIAGNOSIS — Z471 Aftercare following joint replacement surgery: Secondary | ICD-10-CM | POA: Diagnosis not present

## 2016-11-30 DIAGNOSIS — Z1211 Encounter for screening for malignant neoplasm of colon: Secondary | ICD-10-CM | POA: Diagnosis not present

## 2016-11-30 DIAGNOSIS — K640 First degree hemorrhoids: Secondary | ICD-10-CM | POA: Diagnosis not present

## 2016-11-30 DIAGNOSIS — K5730 Diverticulosis of large intestine without perforation or abscess without bleeding: Secondary | ICD-10-CM | POA: Diagnosis not present

## 2017-01-04 DIAGNOSIS — R69 Illness, unspecified: Secondary | ICD-10-CM | POA: Diagnosis not present

## 2017-01-29 DIAGNOSIS — E669 Obesity, unspecified: Secondary | ICD-10-CM | POA: Diagnosis not present

## 2017-01-29 DIAGNOSIS — M1990 Unspecified osteoarthritis, unspecified site: Secondary | ICD-10-CM | POA: Diagnosis not present

## 2017-01-29 DIAGNOSIS — I10 Essential (primary) hypertension: Secondary | ICD-10-CM | POA: Diagnosis not present

## 2017-01-29 DIAGNOSIS — R69 Illness, unspecified: Secondary | ICD-10-CM | POA: Diagnosis not present

## 2017-01-29 DIAGNOSIS — K912 Postsurgical malabsorption, not elsewhere classified: Secondary | ICD-10-CM | POA: Diagnosis not present

## 2017-01-29 DIAGNOSIS — M8580 Other specified disorders of bone density and structure, unspecified site: Secondary | ICD-10-CM | POA: Diagnosis not present

## 2017-02-13 DIAGNOSIS — Z09 Encounter for follow-up examination after completed treatment for conditions other than malignant neoplasm: Secondary | ICD-10-CM | POA: Diagnosis not present

## 2017-02-13 DIAGNOSIS — M25562 Pain in left knee: Secondary | ICD-10-CM | POA: Diagnosis not present

## 2017-02-13 DIAGNOSIS — Z96652 Presence of left artificial knee joint: Secondary | ICD-10-CM | POA: Diagnosis not present

## 2017-04-03 DIAGNOSIS — M79644 Pain in right finger(s): Secondary | ICD-10-CM | POA: Diagnosis not present

## 2017-04-04 DIAGNOSIS — M79642 Pain in left hand: Secondary | ICD-10-CM | POA: Diagnosis not present

## 2017-04-04 DIAGNOSIS — M65311 Trigger thumb, right thumb: Secondary | ICD-10-CM | POA: Diagnosis not present

## 2017-04-09 DIAGNOSIS — E669 Obesity, unspecified: Secondary | ICD-10-CM | POA: Diagnosis not present

## 2017-04-09 DIAGNOSIS — Z7722 Contact with and (suspected) exposure to environmental tobacco smoke (acute) (chronic): Secondary | ICD-10-CM | POA: Diagnosis not present

## 2017-04-09 DIAGNOSIS — Z6832 Body mass index (BMI) 32.0-32.9, adult: Secondary | ICD-10-CM | POA: Diagnosis not present

## 2017-04-09 DIAGNOSIS — I10 Essential (primary) hypertension: Secondary | ICD-10-CM | POA: Diagnosis not present

## 2017-04-09 DIAGNOSIS — G8929 Other chronic pain: Secondary | ICD-10-CM | POA: Diagnosis not present

## 2017-04-09 DIAGNOSIS — M1990 Unspecified osteoarthritis, unspecified site: Secondary | ICD-10-CM | POA: Diagnosis not present

## 2017-04-09 DIAGNOSIS — Z803 Family history of malignant neoplasm of breast: Secondary | ICD-10-CM | POA: Diagnosis not present

## 2017-04-09 DIAGNOSIS — I499 Cardiac arrhythmia, unspecified: Secondary | ICD-10-CM | POA: Diagnosis not present

## 2017-04-09 DIAGNOSIS — Z809 Family history of malignant neoplasm, unspecified: Secondary | ICD-10-CM | POA: Diagnosis not present

## 2017-04-09 DIAGNOSIS — Z791 Long term (current) use of non-steroidal anti-inflammatories (NSAID): Secondary | ICD-10-CM | POA: Diagnosis not present

## 2017-06-21 DIAGNOSIS — D1801 Hemangioma of skin and subcutaneous tissue: Secondary | ICD-10-CM | POA: Diagnosis not present

## 2017-06-21 DIAGNOSIS — D225 Melanocytic nevi of trunk: Secondary | ICD-10-CM | POA: Diagnosis not present

## 2017-06-21 DIAGNOSIS — L821 Other seborrheic keratosis: Secondary | ICD-10-CM | POA: Diagnosis not present

## 2017-06-21 DIAGNOSIS — L570 Actinic keratosis: Secondary | ICD-10-CM | POA: Diagnosis not present

## 2017-06-21 DIAGNOSIS — D485 Neoplasm of uncertain behavior of skin: Secondary | ICD-10-CM | POA: Diagnosis not present

## 2017-06-21 DIAGNOSIS — L814 Other melanin hyperpigmentation: Secondary | ICD-10-CM | POA: Diagnosis not present

## 2017-07-02 DIAGNOSIS — H26493 Other secondary cataract, bilateral: Secondary | ICD-10-CM | POA: Diagnosis not present

## 2017-07-02 DIAGNOSIS — H524 Presbyopia: Secondary | ICD-10-CM | POA: Diagnosis not present

## 2017-07-02 DIAGNOSIS — Z961 Presence of intraocular lens: Secondary | ICD-10-CM | POA: Diagnosis not present

## 2017-07-18 DIAGNOSIS — H26491 Other secondary cataract, right eye: Secondary | ICD-10-CM | POA: Diagnosis not present

## 2017-07-24 DIAGNOSIS — Z1231 Encounter for screening mammogram for malignant neoplasm of breast: Secondary | ICD-10-CM | POA: Diagnosis not present

## 2017-07-25 DIAGNOSIS — H26492 Other secondary cataract, left eye: Secondary | ICD-10-CM | POA: Diagnosis not present

## 2017-08-16 DIAGNOSIS — M25562 Pain in left knee: Secondary | ICD-10-CM | POA: Diagnosis not present

## 2017-08-16 DIAGNOSIS — Z09 Encounter for follow-up examination after completed treatment for conditions other than malignant neoplasm: Secondary | ICD-10-CM | POA: Diagnosis not present

## 2017-08-16 DIAGNOSIS — Z96652 Presence of left artificial knee joint: Secondary | ICD-10-CM | POA: Diagnosis not present

## 2017-08-20 DIAGNOSIS — M65311 Trigger thumb, right thumb: Secondary | ICD-10-CM | POA: Diagnosis not present

## 2017-08-21 DIAGNOSIS — R69 Illness, unspecified: Secondary | ICD-10-CM | POA: Diagnosis not present

## 2017-08-29 DIAGNOSIS — M8580 Other specified disorders of bone density and structure, unspecified site: Secondary | ICD-10-CM | POA: Diagnosis not present

## 2017-08-29 DIAGNOSIS — K219 Gastro-esophageal reflux disease without esophagitis: Secondary | ICD-10-CM | POA: Diagnosis not present

## 2017-08-29 DIAGNOSIS — I10 Essential (primary) hypertension: Secondary | ICD-10-CM | POA: Diagnosis not present

## 2017-08-29 DIAGNOSIS — E669 Obesity, unspecified: Secondary | ICD-10-CM | POA: Diagnosis not present

## 2017-08-29 DIAGNOSIS — M1990 Unspecified osteoarthritis, unspecified site: Secondary | ICD-10-CM | POA: Diagnosis not present

## 2017-08-29 DIAGNOSIS — Z136 Encounter for screening for cardiovascular disorders: Secondary | ICD-10-CM | POA: Diagnosis not present

## 2017-08-29 DIAGNOSIS — K912 Postsurgical malabsorption, not elsewhere classified: Secondary | ICD-10-CM | POA: Diagnosis not present

## 2017-08-29 DIAGNOSIS — Z6832 Body mass index (BMI) 32.0-32.9, adult: Secondary | ICD-10-CM | POA: Diagnosis not present

## 2017-08-29 DIAGNOSIS — R69 Illness, unspecified: Secondary | ICD-10-CM | POA: Diagnosis not present

## 2017-08-29 DIAGNOSIS — Z0000 Encounter for general adult medical examination without abnormal findings: Secondary | ICD-10-CM | POA: Diagnosis not present

## 2017-09-04 DIAGNOSIS — Z96652 Presence of left artificial knee joint: Secondary | ICD-10-CM | POA: Diagnosis not present

## 2017-09-04 DIAGNOSIS — M6281 Muscle weakness (generalized): Secondary | ICD-10-CM | POA: Diagnosis not present

## 2017-09-06 DIAGNOSIS — Z96652 Presence of left artificial knee joint: Secondary | ICD-10-CM | POA: Diagnosis not present

## 2017-09-06 DIAGNOSIS — M6281 Muscle weakness (generalized): Secondary | ICD-10-CM | POA: Diagnosis not present

## 2017-09-10 DIAGNOSIS — Z96652 Presence of left artificial knee joint: Secondary | ICD-10-CM | POA: Diagnosis not present

## 2017-09-10 DIAGNOSIS — M6281 Muscle weakness (generalized): Secondary | ICD-10-CM | POA: Diagnosis not present

## 2017-09-12 DIAGNOSIS — Z23 Encounter for immunization: Secondary | ICD-10-CM | POA: Diagnosis not present

## 2017-09-12 DIAGNOSIS — Z96652 Presence of left artificial knee joint: Secondary | ICD-10-CM | POA: Diagnosis not present

## 2017-09-12 DIAGNOSIS — M6281 Muscle weakness (generalized): Secondary | ICD-10-CM | POA: Diagnosis not present

## 2017-09-17 DIAGNOSIS — Z96652 Presence of left artificial knee joint: Secondary | ICD-10-CM | POA: Diagnosis not present

## 2017-09-17 DIAGNOSIS — M6281 Muscle weakness (generalized): Secondary | ICD-10-CM | POA: Diagnosis not present

## 2017-09-19 DIAGNOSIS — M6281 Muscle weakness (generalized): Secondary | ICD-10-CM | POA: Diagnosis not present

## 2017-09-19 DIAGNOSIS — Z96652 Presence of left artificial knee joint: Secondary | ICD-10-CM | POA: Diagnosis not present

## 2017-09-24 DIAGNOSIS — M6281 Muscle weakness (generalized): Secondary | ICD-10-CM | POA: Diagnosis not present

## 2017-09-24 DIAGNOSIS — Z96652 Presence of left artificial knee joint: Secondary | ICD-10-CM | POA: Diagnosis not present

## 2017-09-28 DIAGNOSIS — Z96652 Presence of left artificial knee joint: Secondary | ICD-10-CM | POA: Diagnosis not present

## 2017-09-28 DIAGNOSIS — M6281 Muscle weakness (generalized): Secondary | ICD-10-CM | POA: Diagnosis not present

## 2017-10-03 DIAGNOSIS — M6281 Muscle weakness (generalized): Secondary | ICD-10-CM | POA: Diagnosis not present

## 2017-10-03 DIAGNOSIS — Z96652 Presence of left artificial knee joint: Secondary | ICD-10-CM | POA: Diagnosis not present

## 2017-10-05 DIAGNOSIS — Z96652 Presence of left artificial knee joint: Secondary | ICD-10-CM | POA: Diagnosis not present

## 2017-10-05 DIAGNOSIS — M6281 Muscle weakness (generalized): Secondary | ICD-10-CM | POA: Diagnosis not present

## 2017-10-09 DIAGNOSIS — Z96652 Presence of left artificial knee joint: Secondary | ICD-10-CM | POA: Diagnosis not present

## 2017-10-09 DIAGNOSIS — M6281 Muscle weakness (generalized): Secondary | ICD-10-CM | POA: Diagnosis not present

## 2017-10-11 DIAGNOSIS — M6281 Muscle weakness (generalized): Secondary | ICD-10-CM | POA: Diagnosis not present

## 2017-10-11 DIAGNOSIS — Z96652 Presence of left artificial knee joint: Secondary | ICD-10-CM | POA: Diagnosis not present

## 2017-10-15 ENCOUNTER — Other Ambulatory Visit: Payer: Self-pay | Admitting: Orthopedic Surgery

## 2017-10-15 DIAGNOSIS — M65311 Trigger thumb, right thumb: Secondary | ICD-10-CM | POA: Diagnosis not present

## 2017-10-16 DIAGNOSIS — M6281 Muscle weakness (generalized): Secondary | ICD-10-CM | POA: Diagnosis not present

## 2017-10-16 DIAGNOSIS — Z96652 Presence of left artificial knee joint: Secondary | ICD-10-CM | POA: Diagnosis not present

## 2017-10-17 DIAGNOSIS — M8588 Other specified disorders of bone density and structure, other site: Secondary | ICD-10-CM | POA: Diagnosis not present

## 2017-10-18 DIAGNOSIS — Z96652 Presence of left artificial knee joint: Secondary | ICD-10-CM | POA: Diagnosis not present

## 2017-10-18 DIAGNOSIS — M6281 Muscle weakness (generalized): Secondary | ICD-10-CM | POA: Diagnosis not present

## 2017-10-23 DIAGNOSIS — M6281 Muscle weakness (generalized): Secondary | ICD-10-CM | POA: Diagnosis not present

## 2017-10-23 DIAGNOSIS — Z96652 Presence of left artificial knee joint: Secondary | ICD-10-CM | POA: Diagnosis not present

## 2017-10-25 ENCOUNTER — Other Ambulatory Visit: Payer: Self-pay

## 2017-10-25 ENCOUNTER — Encounter (HOSPITAL_BASED_OUTPATIENT_CLINIC_OR_DEPARTMENT_OTHER): Payer: Self-pay | Admitting: *Deleted

## 2017-10-25 DIAGNOSIS — Z96652 Presence of left artificial knee joint: Secondary | ICD-10-CM | POA: Diagnosis not present

## 2017-10-25 DIAGNOSIS — M6281 Muscle weakness (generalized): Secondary | ICD-10-CM | POA: Diagnosis not present

## 2017-10-26 ENCOUNTER — Other Ambulatory Visit: Payer: Self-pay

## 2017-10-26 ENCOUNTER — Encounter (HOSPITAL_BASED_OUTPATIENT_CLINIC_OR_DEPARTMENT_OTHER)
Admission: RE | Admit: 2017-10-26 | Discharge: 2017-10-26 | Disposition: A | Discharge status: Home / Self Care | Payer: Medicare HMO | Source: Ambulatory Visit | Attending: Orthopedic Surgery | Admitting: Orthopedic Surgery

## 2017-10-26 DIAGNOSIS — Z01818 Encounter for other preprocedural examination: Secondary | ICD-10-CM | POA: Diagnosis not present

## 2017-10-26 LAB — BASIC METABOLIC PANEL
Anion gap: 5 (ref 5–15)
BUN: 21 mg/dL (ref 8–23)
CALCIUM: 9.7 mg/dL (ref 8.9–10.3)
CHLORIDE: 110 mmol/L (ref 98–111)
CO2: 26 mmol/L (ref 22–32)
CREATININE: 1.02 mg/dL — AB (ref 0.44–1.00)
GFR calc Af Amer: >60 mL/min (ref >60)
GFR calc non Af Amer: 56 mL/min — ABNORMAL LOW (ref >60)
GLUCOSE: 83 mg/dL (ref 70–99)
Potassium: 5.4 mmol/L — ABNORMAL HIGH (ref 3.5–5.1)
Sodium: 141 mmol/L (ref 135–145)

## 2017-10-29 NOTE — Progress Notes (Signed)
Lab results reviewed by Dr. Kerin Perna, will proceed with surgery as scheduled.

## 2017-11-01 ENCOUNTER — Ambulatory Visit (HOSPITAL_BASED_OUTPATIENT_CLINIC_OR_DEPARTMENT_OTHER)
Admission: RE | Admit: 2017-11-01 | Discharge: 2017-11-01 | Disposition: A | Discharge status: Home / Self Care | Payer: Medicare HMO | Source: Ambulatory Visit | Attending: Orthopedic Surgery | Admitting: Orthopedic Surgery

## 2017-11-01 ENCOUNTER — Ambulatory Visit (HOSPITAL_BASED_OUTPATIENT_CLINIC_OR_DEPARTMENT_OTHER): Payer: Medicare HMO | Admitting: Anesthesiology

## 2017-11-01 ENCOUNTER — Other Ambulatory Visit: Payer: Self-pay

## 2017-11-01 ENCOUNTER — Encounter (HOSPITAL_BASED_OUTPATIENT_CLINIC_OR_DEPARTMENT_OTHER)
Admission: RE | Disposition: A | Discharge status: Home / Self Care | Payer: Self-pay | Source: Ambulatory Visit | Attending: Orthopedic Surgery

## 2017-11-01 ENCOUNTER — Encounter (HOSPITAL_BASED_OUTPATIENT_CLINIC_OR_DEPARTMENT_OTHER): Payer: Self-pay | Admitting: *Deleted

## 2017-11-01 DIAGNOSIS — Z79899 Other long term (current) drug therapy: Secondary | ICD-10-CM | POA: Diagnosis not present

## 2017-11-01 DIAGNOSIS — M65311 Trigger thumb, right thumb: Secondary | ICD-10-CM | POA: Insufficient documentation

## 2017-11-01 DIAGNOSIS — K219 Gastro-esophageal reflux disease without esophagitis: Secondary | ICD-10-CM | POA: Diagnosis not present

## 2017-11-01 DIAGNOSIS — Z9884 Bariatric surgery status: Secondary | ICD-10-CM | POA: Diagnosis not present

## 2017-11-01 DIAGNOSIS — Z87442 Personal history of urinary calculi: Secondary | ICD-10-CM | POA: Insufficient documentation

## 2017-11-01 DIAGNOSIS — I10 Essential (primary) hypertension: Secondary | ICD-10-CM | POA: Diagnosis not present

## 2017-11-01 DIAGNOSIS — M1990 Unspecified osteoarthritis, unspecified site: Secondary | ICD-10-CM | POA: Diagnosis not present

## 2017-11-01 DIAGNOSIS — Z87891 Personal history of nicotine dependence: Secondary | ICD-10-CM | POA: Insufficient documentation

## 2017-11-01 HISTORY — PX: TRIGGER FINGER RELEASE: SHX641

## 2017-11-01 SURGERY — RELEASE, A1 PULLEY, FOR TRIGGER FINGER
Anesthesia: Regional | Site: Thumb | Laterality: Right

## 2017-11-01 MED ORDER — CEFAZOLIN SODIUM-DEXTROSE 2-4 GM/100ML-% IV SOLN
2.0000 g | INTRAVENOUS | Status: AC
  Administered 2017-11-01: 2 g via INTRAVENOUS

## 2017-11-01 MED ORDER — FENTANYL CITRATE (PF) 100 MCG/2ML IJ SOLN
50.0000 ug | INTRAMUSCULAR | Status: DC | PRN
  Administered 2017-11-01 (×2): 50 ug via INTRAVENOUS

## 2017-11-01 MED ORDER — MEPERIDINE HCL 25 MG/ML IJ SOLN: 6.2500 mg | INTRAMUSCULAR | Status: DC | PRN

## 2017-11-01 MED ORDER — SCOPOLAMINE 1 MG/3DAYS TD PT72: 1.0000 | MEDICATED_PATCH | Freq: Once | TRANSDERMAL | Status: DC | PRN

## 2017-11-01 MED ORDER — LIDOCAINE HCL (PF) 0.5 % IJ SOLN
INTRAMUSCULAR | Status: DC | PRN
  Administered 2017-11-01: 30 mL via INTRAVENOUS

## 2017-11-01 MED ORDER — MIDAZOLAM HCL 2 MG/2ML IJ SOLN
INTRAMUSCULAR | Status: AC
  Filled 2017-11-01: qty 2

## 2017-11-01 MED ORDER — ROCURONIUM BROMIDE 50 MG/5ML IV SOSY
PREFILLED_SYRINGE | INTRAVENOUS | Status: AC
  Filled 2017-11-01: qty 5

## 2017-11-01 MED ORDER — LACTATED RINGERS IV SOLN: INTRAVENOUS | Status: DC

## 2017-11-01 MED ORDER — ONDANSETRON HCL 4 MG/2ML IJ SOLN
INTRAMUSCULAR | Status: DC | PRN
  Administered 2017-11-01: 4 mg via INTRAVENOUS

## 2017-11-01 MED ORDER — PROPOFOL 500 MG/50ML IV EMUL
INTRAVENOUS | Status: AC
  Filled 2017-11-01: qty 50

## 2017-11-01 MED ORDER — EPHEDRINE 5 MG/ML INJ
INTRAVENOUS | Status: AC
  Filled 2017-11-01: qty 10

## 2017-11-01 MED ORDER — CEFAZOLIN SODIUM-DEXTROSE 2-4 GM/100ML-% IV SOLN
INTRAVENOUS | Status: AC
  Filled 2017-11-01: qty 100

## 2017-11-01 MED ORDER — FENTANYL CITRATE (PF) 100 MCG/2ML IJ SOLN
INTRAMUSCULAR | Status: AC
  Filled 2017-11-01: qty 2

## 2017-11-01 MED ORDER — HYDROCODONE-ACETAMINOPHEN 5-325 MG PO TABS: ORAL_TABLET | ORAL | 0 refills | Status: DC

## 2017-11-01 MED ORDER — BUPIVACAINE HCL (PF) 0.25 % IJ SOLN
INTRAMUSCULAR | Status: DC | PRN
  Administered 2017-11-01: 7 mL

## 2017-11-01 MED ORDER — MIDAZOLAM HCL 2 MG/2ML IJ SOLN
1.0000 mg | INTRAMUSCULAR | Status: DC | PRN
  Administered 2017-11-01 (×2): 1 mg via INTRAVENOUS

## 2017-11-01 MED ORDER — PHENYLEPHRINE 40 MCG/ML (10ML) SYRINGE FOR IV PUSH (FOR BLOOD PRESSURE SUPPORT)
PREFILLED_SYRINGE | INTRAVENOUS | Status: AC
  Filled 2017-11-01: qty 10

## 2017-11-01 MED ORDER — CHLORHEXIDINE GLUCONATE 4 % EX LIQD: 60.0000 mL | Freq: Once | CUTANEOUS | Status: DC

## 2017-11-01 MED ORDER — ONDANSETRON HCL 4 MG/2ML IJ SOLN
INTRAMUSCULAR | Status: AC
  Filled 2017-11-01: qty 2

## 2017-11-01 MED ORDER — SUCCINYLCHOLINE CHLORIDE 200 MG/10ML IV SOSY
PREFILLED_SYRINGE | INTRAVENOUS | Status: AC
  Filled 2017-11-01: qty 10

## 2017-11-01 MED ORDER — DEXAMETHASONE SODIUM PHOSPHATE 10 MG/ML IJ SOLN
INTRAMUSCULAR | Status: AC
  Filled 2017-11-01: qty 1

## 2017-11-01 MED ORDER — LACTATED RINGERS IV SOLN
INTRAVENOUS | Status: DC
  Administered 2017-11-01: 08:00:00 via INTRAVENOUS

## 2017-11-01 MED ORDER — LIDOCAINE 2% (20 MG/ML) 5 ML SYRINGE
INTRAMUSCULAR | Status: AC
  Filled 2017-11-01: qty 5

## 2017-11-01 MED ORDER — FENTANYL CITRATE (PF) 100 MCG/2ML IJ SOLN: 25.0000 ug | INTRAMUSCULAR | Status: DC | PRN

## 2017-11-01 MED ORDER — METOCLOPRAMIDE HCL 5 MG/ML IJ SOLN: 10.0000 mg | Freq: Once | INTRAMUSCULAR | Status: DC | PRN

## 2017-11-01 MED ORDER — PROPOFOL 10 MG/ML IV BOLUS
INTRAVENOUS | Status: DC | PRN
  Administered 2017-11-01: 20 mg via INTRAVENOUS

## 2017-11-01 SURGICAL SUPPLY — 34 items
BANDAGE COBAN STERILE 2 (GAUZE/BANDAGES/DRESSINGS) ×2 IMPLANT
BLADE SURG 15 STRL LF DISP TIS (BLADE) ×2 IMPLANT
BLADE SURG 15 STRL SS (BLADE) ×4
BNDG CMPR 9X4 STRL LF SNTH (GAUZE/BANDAGES/DRESSINGS) ×1
BNDG ESMARK 4X9 LF (GAUZE/BANDAGES/DRESSINGS) ×1 IMPLANT
CHLORAPREP W/TINT 26ML (MISCELLANEOUS) ×2 IMPLANT
CORD BIPOLAR FORCEPS 12FT (ELECTRODE) ×2 IMPLANT
COVER BACK TABLE 60X90IN (DRAPES) ×2 IMPLANT
COVER MAYO STAND STRL (DRAPES) ×2 IMPLANT
COVER WAND RF STERILE (DRAPES) IMPLANT
CUFF TOURNIQUET SINGLE 18IN (TOURNIQUET CUFF) ×2 IMPLANT
DRAPE EXTREMITY T 121X128X90 (DRAPE) ×2 IMPLANT
DRAPE SURG 17X23 STRL (DRAPES) ×2 IMPLANT
GAUZE SPONGE 4X4 12PLY STRL (GAUZE/BANDAGES/DRESSINGS) ×2 IMPLANT
GAUZE XEROFORM 1X8 LF (GAUZE/BANDAGES/DRESSINGS) ×2 IMPLANT
GLOVE BIO SURGEON STRL SZ7.5 (GLOVE) ×2 IMPLANT
GLOVE BIOGEL M STRL SZ7.5 (GLOVE) ×1 IMPLANT
GLOVE BIOGEL PI IND STRL 7.0 (GLOVE) IMPLANT
GLOVE BIOGEL PI IND STRL 8 (GLOVE) ×1 IMPLANT
GLOVE BIOGEL PI INDICATOR 7.0 (GLOVE) ×1
GLOVE BIOGEL PI INDICATOR 8 (GLOVE) ×2
GOWN STRL REUS W/ TWL LRG LVL3 (GOWN DISPOSABLE) ×1 IMPLANT
GOWN STRL REUS W/TWL LRG LVL3 (GOWN DISPOSABLE) ×2
GOWN STRL REUS W/TWL XL LVL3 (GOWN DISPOSABLE) ×2 IMPLANT
NDL HYPO 25X1 1.5 SAFETY (NEEDLE) ×1 IMPLANT
NEEDLE HYPO 25X1 1.5 SAFETY (NEEDLE) ×2 IMPLANT
NS IRRIG 1000ML POUR BTL (IV SOLUTION) ×2 IMPLANT
PACK BASIN DAY SURGERY FS (CUSTOM PROCEDURE TRAY) ×2 IMPLANT
STOCKINETTE 4X48 STRL (DRAPES) ×2 IMPLANT
SUT ETHILON 4 0 PS 2 18 (SUTURE) ×2 IMPLANT
SYR BULB 3OZ (MISCELLANEOUS) ×2 IMPLANT
SYR CONTROL 10ML LL (SYRINGE) ×2 IMPLANT
TOWEL GREEN STERILE FF (TOWEL DISPOSABLE) ×4 IMPLANT
UNDERPAD 30X30 (UNDERPADS AND DIAPERS) ×2 IMPLANT

## 2017-11-01 NOTE — H&P (Signed)
  Laura Bean is an 67 y.o. female.   Chief Complaint: right trigger thumb HPI: 67 yo female with triggering right thumb.  This has been injected without lasting relief.  She wishes to have a trigger release.  Allergies:  Allergies  Allergen Reactions  . Lisinopril Cough    Past Medical History:  Diagnosis Date  . Arthritis   . Family history of adverse reaction to anesthesia    ? father confusion with anesthesia when older  . GERD (gastroesophageal reflux disease)    occ  . History of kidney stones   . Hypertension     Past Surgical History:  Procedure Laterality Date  . ADENOIDECTOMY     child  . ROUX-EN-Y GASTRIC BYPASS  2002  . SINUS EXPLORATION    . THIGH LIFT     outer  . TOTAL KNEE ARTHROPLASTY Left 08/14/2016   Procedure: TOTAL KNEE ARTHROPLASTY;  Surgeon: Frederik Pear, MD;  Location: [facility];  Service: Orthopedics;  Laterality: Left;    Family History: Family History  Problem Relation Age of Onset  . Aortic stenosis Mother   . Arrhythmia Mother   . Aortic stenosis Father     Social History:   reports that she quit smoking about 17 years ago. She has never used smokeless tobacco. She reports that she drinks alcohol. She reports that she does not use drugs.  Medications: Medications Prior to Admission  Medication Sig Dispense Refill  . calcium carbonate (OSCAL) 1500 (600 Ca) MG TABS tablet Take 1,200 mg by mouth at bedtime.    . cholecalciferol (VITAMIN D) 1000 units tablet Take 1,000 Units by mouth every evening.    . diphenhydramine-acetaminophen (TYLENOL PM) 25-500 MG TABS tablet Take 1 tablet by mouth at bedtime.    . Glucosamine 500 MG CAPS Take 500 mg by mouth at bedtime.    . Multiple Vitamin (MULTIVITAMIN) capsule Take 1 capsule by mouth daily.    . naproxen (NAPROSYN) 375 MG tablet Take 375 mg by mouth 2 (two) times daily with a meal.    . valsartan-hydrochlorothiazide (DIOVAN-HCT) 80-12.5 MG tablet Take 1 tablet by mouth at bedtime.     . vitamin  B-12 (CYANOCOBALAMIN) 1000 MCG tablet Take 1,000 mcg by mouth 2 (two) times a week. Monday & Friday      No results found for this or any previous visit (from the past 48 hour(s)).  No results found.   A comprehensive review of systems was negative.  Blood pressure 131/71, pulse 61, temperature 98.1 F (36.7 C), temperature source Oral, resp. rate 18, height 5' 4.5" (1.638 m), weight 89.5 kg, SpO2 100 %.  General appearance: alert, cooperative and appears stated age Head: Normocephalic, without obvious abnormality, atraumatic Neck: supple, symmetrical, trachea midline Cardio: regular rate and rhythm Resp: clear to auscultation bilaterally Extremities: Intact sensation and capillary refill all digits.  +epl/fpl/io.  No wounds.  Pulses: 2+ and symmetric Skin: Skin color, texture, turgor normal. No rashes or lesions Neurologic: Grossly normal Incision/Wound: none  Assessment/Plan Right trigger thumb.  Non operative and operative treatment options have been discussed with the patient and patient wishes to proceed with operative treatment. Risks, benefits, and alternatives of surgery have been discussed and the patient agrees with the plan of care.   [name] 11/01/2017, 8:39 AM

## 2017-11-01 NOTE — Op Note (Signed)
11/01/2017 [facility] OPERATIVE REPORT   PREOPERATIVE DIAGNOSIS: Right trigger thumb.  POSTOPERATIVE DIAGNOSIS:  Right trigger thumb.  PROCEDURE: Right trigger thumb release.  SURGEON:  [name], MD  ASSISTANT:  None.  ANESTHESIA:  Bier block with sedation.  IV FLUIDS:  Per anesthesia flow sheet.  ESTIMATED BLOOD LOSS:  Minimal.  COMPLICATIONS:  None.  SPECIMENS:  None.  TOURNIQUET TIME:  Total Tourniquet Time Documented: Forearm (Right) - 18 minutes Total: Forearm (Right) - 18 minutes   DISPOSITION:  Stable to PACU.  LOCATION: [facility]  INDICATIONS: Taleia Sadowski is a 67 y.o. female with triggering right thumb.  Recurrent after two injections.  She wishes to have a trigger release.  Risks, benefits and alternatives of surgery were discussed including the risk of blood loss, infection, damage to nerves, vessels, tendons, ligaments, bone, failure of surgery, need for additional surgery, complications with wound healing, continued pain, continued triggering and need for repeat surgery.  She voiced understanding of these risks and elected to proceed.  OPERATIVE COURSE:  After being identified preoperatively by myself, the patient and I agreed upon the procedure and site of procedure.  The surgical site was marked. The risks, benefits, and alternatives of surgery were reviewed and she wished to proceed.  Surgical consent had been signed. She was given IV Ancef as preoperative antibiotic prophylaxis. She was transported to the operating room and placed on the operating room table in supine position with the Right upper extremity on an arm board. Bier block anesthesia was induced by the anesthesiologist.  The Right upper extremity was prepped and draped in normal sterile orthopedic fashion. A surgical pause was performed between surgeons, anesthesia, and operating room staff, and all were in agreement as to the patient, procedure, and site of  procedure.  Tourniquet at the proximal aspect of the forearm had been inflated for the Bier block.  An incision was made transversely at the MP flexion crease of the thumb.  This was made through the skin only.  Spreading technique was used.  The radial and ulnar digital nerves were identified and were protected throughout the case. The flexor sheath was identified.  The A1 pulley was identified.  It was sharply incised.  It was released in its entirety.  Care was taken to avoid any release of the oblique pulley. The thumb was placed through a range of motion, there was noted to be no catching.  The wound was copiously irrigated with sterile saline. It was then closed with 4-0 nylon in a horizontal mattress fashion.  The wound was injected with  0.25% plain Marcaine to aid in postoperative analgesia.  It was then dressed with sterile Xeroform, 4x4s, and wrapped lightly with a Coban dressing.  Tourniquet was deflated at 18 minutes.  The fingertips were pink with brisk capillary refill after deflation of the tourniquet.  The operative drapes were broken down and the patient was awoken from anesthesia safely.  She was transferred back to the stretcher and taken to the PACU in stable condition.   I will see her back in the office in 1 week for postoperative followup.  I will give her a prescription for Norco 5/325 1-2 tabs PO q6 hours prn pain, dispense # 20.    [name], MD Electronically signed, 11/01/17

## 2017-11-01 NOTE — Anesthesia Postprocedure Evaluation (Signed)
Anesthesia Post Note  Patient: Laura Bean  Procedure(s) Performed: RIGHT THUMB TRIGGER RELEASE (Right Thumb)     Patient location during evaluation: PACU Anesthesia Type: Bier Block Level of consciousness: awake and alert Pain management: pain level controlled Vital Signs Assessment: post-procedure vital signs reviewed and stable Respiratory status: spontaneous breathing, nonlabored ventilation, respiratory function stable and patient connected to nasal cannula oxygen Cardiovascular status: stable and blood pressure returned to baseline Postop Assessment: no apparent nausea or vomiting Anesthetic complications: no    Last Vitals:  Vitals:   11/01/17 0930 11/01/17 0944  BP: (!) 106/55 120/70  Pulse: 60 (!) 58  Resp: 11 18  Temp:  37.2 C  SpO2: 99% 100%    Last Pain:  Vitals:   11/01/17 0944  TempSrc:   PainSc: 0-No pain                 Montez Hageman

## 2017-11-01 NOTE — Anesthesia Preprocedure Evaluation (Signed)
Anesthesia Evaluation  Patient identified by MRN, date of birth, ID band Patient awake    Reviewed: Allergy & Precautions, NPO status , Patient's Chart, lab work & pertinent test results  Airway Mallampati: II  TM Distance: >3 FB Neck ROM: Full    Dental no notable dental hx.    Pulmonary neg pulmonary ROS, former smoker,    Pulmonary exam normal breath sounds clear to auscultation       Cardiovascular hypertension, Pt. on medications Normal cardiovascular exam Rhythm:Regular Rate:Normal     Neuro/Psych negative neurological ROS  negative psych ROS   GI/Hepatic Neg liver ROS, GERD  Controlled,  Endo/Other  negative endocrine ROS  Renal/GU negative Renal ROS  negative genitourinary   Musculoskeletal negative musculoskeletal ROS (+)   Abdominal   Peds negative pediatric ROS (+)  Hematology negative hematology ROS (+)   Anesthesia Other Findings   Reproductive/Obstetrics negative OB ROS                             Anesthesia Physical Anesthesia Plan  ASA: II  Anesthesia Plan: Bier Block and Bier Block-LIDOCAINE ONLY   Post-op Pain Management:    Induction:   PONV Risk Score and Plan: 2  Airway Management Planned: Simple Face Mask and Nasal Cannula  Additional Equipment:   Intra-op Plan:   Post-operative Plan:   Informed Consent: I have reviewed the patients History and Physical, chart, labs and discussed the procedure including the risks, benefits and alternatives for the proposed anesthesia with the patient or authorized representative who has indicated his/her understanding and acceptance.   Dental advisory given  Plan Discussed with:   Anesthesia Plan Comments:         Anesthesia Quick Evaluation

## 2017-11-01 NOTE — Transfer of Care (Signed)
Immediate Anesthesia Transfer of Care Note  Patient: Laura Bean  Procedure(s) Performed: RIGHT THUMB TRIGGER RELEASE (Right Thumb)  Patient Location: PACU  Anesthesia Type:MAC and Bier block  Level of Consciousness: awake, alert  and oriented  Airway & Oxygen Therapy: Patient Spontanous Breathing and Patient connected to face mask oxygen  Post-op Assessment: Report given to RN and Post -op Vital signs reviewed and stable  Post vital signs: Reviewed and stable  Last Vitals:  Vitals Value Taken Time  BP    Temp    Pulse 79 11/01/2017  9:16 AM  Resp 12 11/01/2017  9:16 AM  SpO2 98 % 11/01/2017  9:16 AM  Vitals shown include unvalidated device data.  Last Pain:  Vitals:   11/01/17 0758  TempSrc:   PainSc: 0-No pain         Complications: No apparent anesthesia complications

## 2017-11-01 NOTE — Discharge Instructions (Addendum)

## 2017-11-02 ENCOUNTER — Encounter (HOSPITAL_BASED_OUTPATIENT_CLINIC_OR_DEPARTMENT_OTHER): Payer: Self-pay | Admitting: Orthopedic Surgery

## 2018-01-07 DIAGNOSIS — E538 Deficiency of other specified B group vitamins: Secondary | ICD-10-CM | POA: Diagnosis not present

## 2018-01-10 DIAGNOSIS — M25562 Pain in left knee: Secondary | ICD-10-CM | POA: Diagnosis not present

## 2018-02-25 DIAGNOSIS — R69 Illness, unspecified: Secondary | ICD-10-CM | POA: Diagnosis not present

## 2018-04-02 DIAGNOSIS — I10 Essential (primary) hypertension: Secondary | ICD-10-CM | POA: Diagnosis not present

## 2018-04-02 DIAGNOSIS — M8580 Other specified disorders of bone density and structure, unspecified site: Secondary | ICD-10-CM | POA: Diagnosis not present

## 2018-04-02 DIAGNOSIS — E538 Deficiency of other specified B group vitamins: Secondary | ICD-10-CM | POA: Diagnosis not present

## 2018-04-02 DIAGNOSIS — K219 Gastro-esophageal reflux disease without esophagitis: Secondary | ICD-10-CM | POA: Diagnosis not present

## 2018-04-02 DIAGNOSIS — K912 Postsurgical malabsorption, not elsewhere classified: Secondary | ICD-10-CM | POA: Diagnosis not present

## 2018-04-02 DIAGNOSIS — E669 Obesity, unspecified: Secondary | ICD-10-CM | POA: Diagnosis not present

## 2018-04-02 DIAGNOSIS — R69 Illness, unspecified: Secondary | ICD-10-CM | POA: Diagnosis not present

## 2018-04-02 DIAGNOSIS — M1990 Unspecified osteoarthritis, unspecified site: Secondary | ICD-10-CM | POA: Diagnosis not present

## 2018-06-13 DIAGNOSIS — Z888 Allergy status to other drugs, medicaments and biological substances status: Secondary | ICD-10-CM | POA: Diagnosis not present

## 2018-06-13 DIAGNOSIS — Z791 Long term (current) use of non-steroidal anti-inflammatories (NSAID): Secondary | ICD-10-CM | POA: Diagnosis not present

## 2018-06-13 DIAGNOSIS — Z803 Family history of malignant neoplasm of breast: Secondary | ICD-10-CM | POA: Diagnosis not present

## 2018-06-13 DIAGNOSIS — Z809 Family history of malignant neoplasm, unspecified: Secondary | ICD-10-CM | POA: Diagnosis not present

## 2018-06-13 DIAGNOSIS — Z87891 Personal history of nicotine dependence: Secondary | ICD-10-CM | POA: Diagnosis not present

## 2018-06-13 DIAGNOSIS — M1990 Unspecified osteoarthritis, unspecified site: Secondary | ICD-10-CM | POA: Diagnosis not present

## 2018-06-13 DIAGNOSIS — G8929 Other chronic pain: Secondary | ICD-10-CM | POA: Diagnosis not present

## 2018-06-13 DIAGNOSIS — R32 Unspecified urinary incontinence: Secondary | ICD-10-CM | POA: Diagnosis not present

## 2018-06-13 DIAGNOSIS — Z8249 Family history of ischemic heart disease and other diseases of the circulatory system: Secondary | ICD-10-CM | POA: Diagnosis not present

## 2018-06-13 DIAGNOSIS — I10 Essential (primary) hypertension: Secondary | ICD-10-CM | POA: Diagnosis not present

## 2018-07-01 DIAGNOSIS — L570 Actinic keratosis: Secondary | ICD-10-CM | POA: Diagnosis not present

## 2018-07-01 DIAGNOSIS — D1801 Hemangioma of skin and subcutaneous tissue: Secondary | ICD-10-CM | POA: Diagnosis not present

## 2018-07-01 DIAGNOSIS — D2361 Other benign neoplasm of skin of right upper limb, including shoulder: Secondary | ICD-10-CM | POA: Diagnosis not present

## 2018-07-01 DIAGNOSIS — L821 Other seborrheic keratosis: Secondary | ICD-10-CM | POA: Diagnosis not present

## 2018-07-01 DIAGNOSIS — L814 Other melanin hyperpigmentation: Secondary | ICD-10-CM | POA: Diagnosis not present

## 2018-07-19 DIAGNOSIS — Z20828 Contact with and (suspected) exposure to other viral communicable diseases: Secondary | ICD-10-CM | POA: Diagnosis not present

## 2018-07-29 DIAGNOSIS — Z961 Presence of intraocular lens: Secondary | ICD-10-CM | POA: Diagnosis not present

## 2018-07-29 DIAGNOSIS — H52203 Unspecified astigmatism, bilateral: Secondary | ICD-10-CM | POA: Diagnosis not present

## 2018-07-29 DIAGNOSIS — H5203 Hypermetropia, bilateral: Secondary | ICD-10-CM | POA: Diagnosis not present

## 2018-07-29 DIAGNOSIS — H524 Presbyopia: Secondary | ICD-10-CM | POA: Diagnosis not present

## 2018-07-30 DIAGNOSIS — Z1239 Encounter for other screening for malignant neoplasm of breast: Secondary | ICD-10-CM | POA: Diagnosis not present

## 2018-07-30 DIAGNOSIS — Z1231 Encounter for screening mammogram for malignant neoplasm of breast: Secondary | ICD-10-CM | POA: Diagnosis not present

## 2018-08-03 IMAGING — DX DG KNEE COMPLETE 4+V*R*
4 series · 4 of 4 positions shown · non-contrast
Comparison: None.

CLINICAL DATA: 65-year-old presenting with 5 month history of
chronic intermittent bilateral knee pain localizing to the
patellofemoral joint on the right. No known injury.

EXAM:
RIGHT KNEE - COMPLETE 4+ VIEW

[dg knee complete 4 views right (1 of 4)]
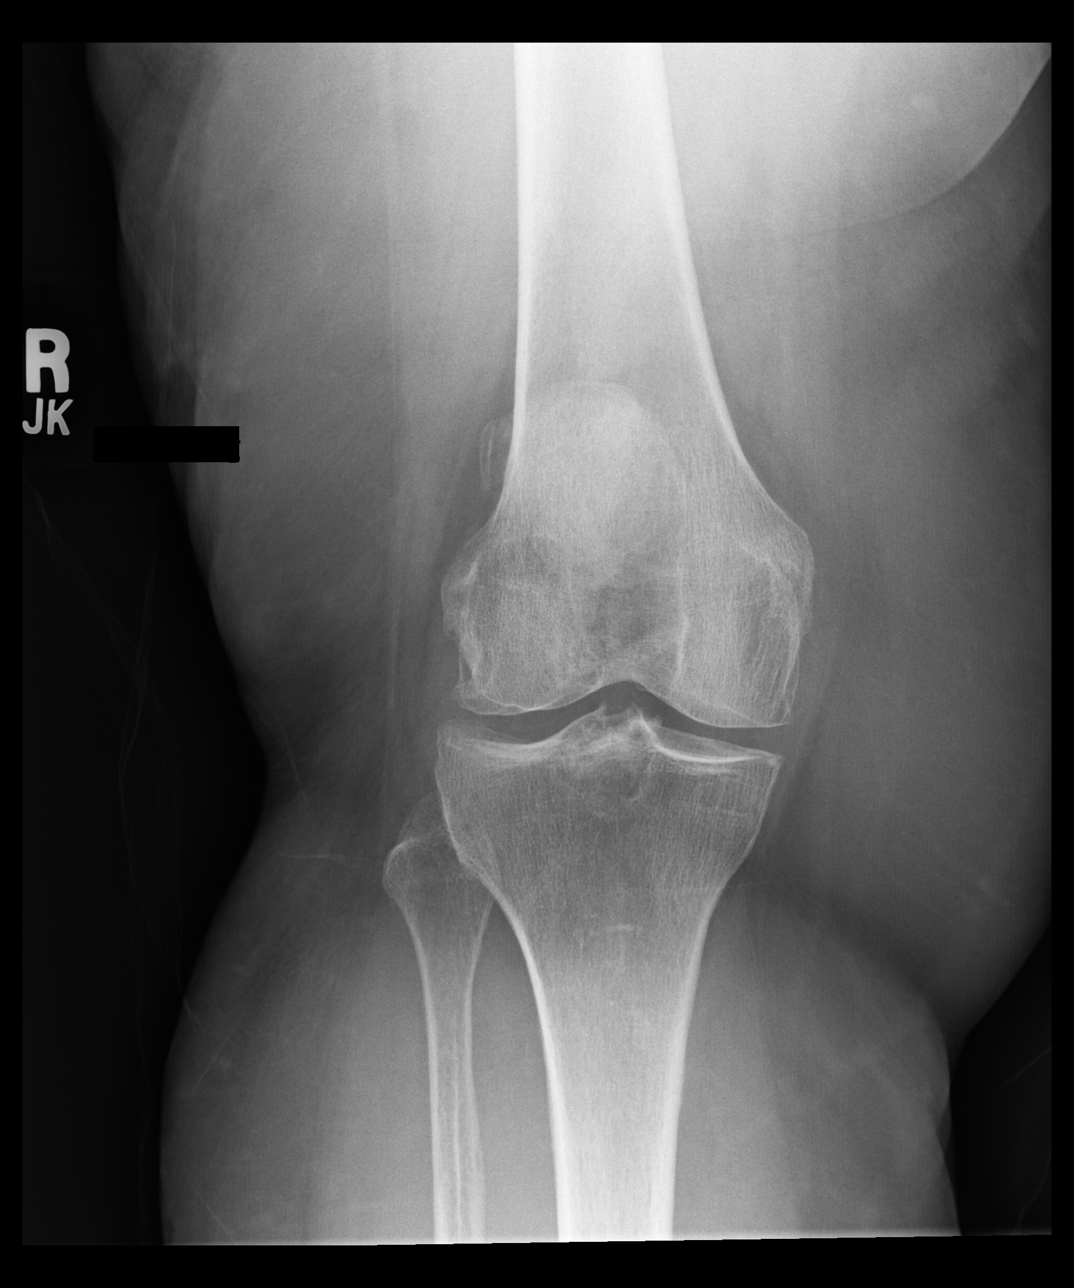

[dg knee complete 4 views right (2 of 4)]
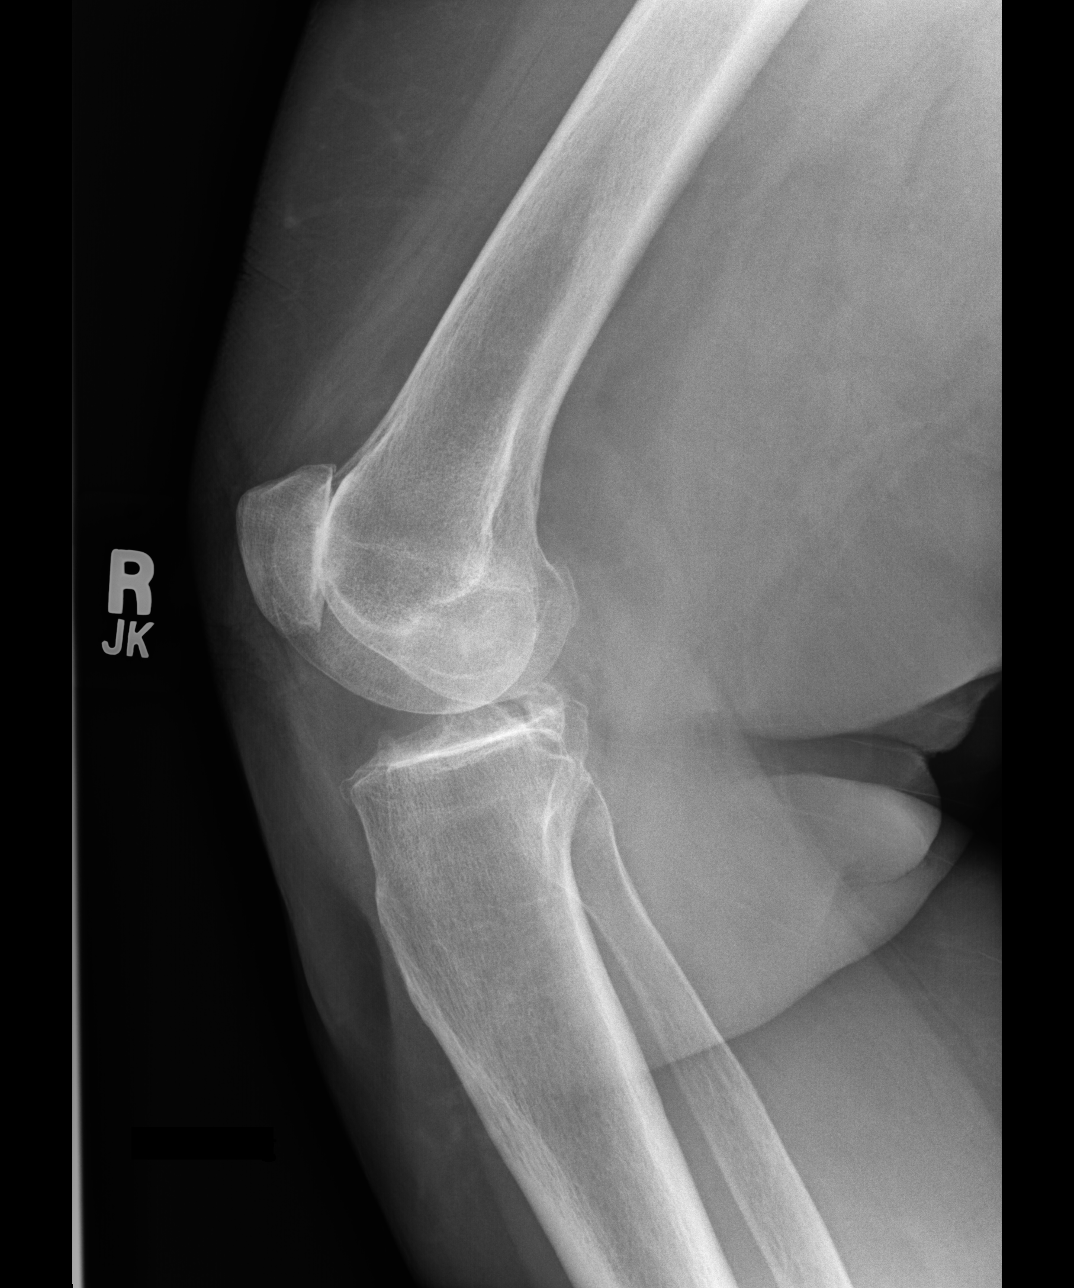

[dg knee complete 4 views right (3 of 4)]
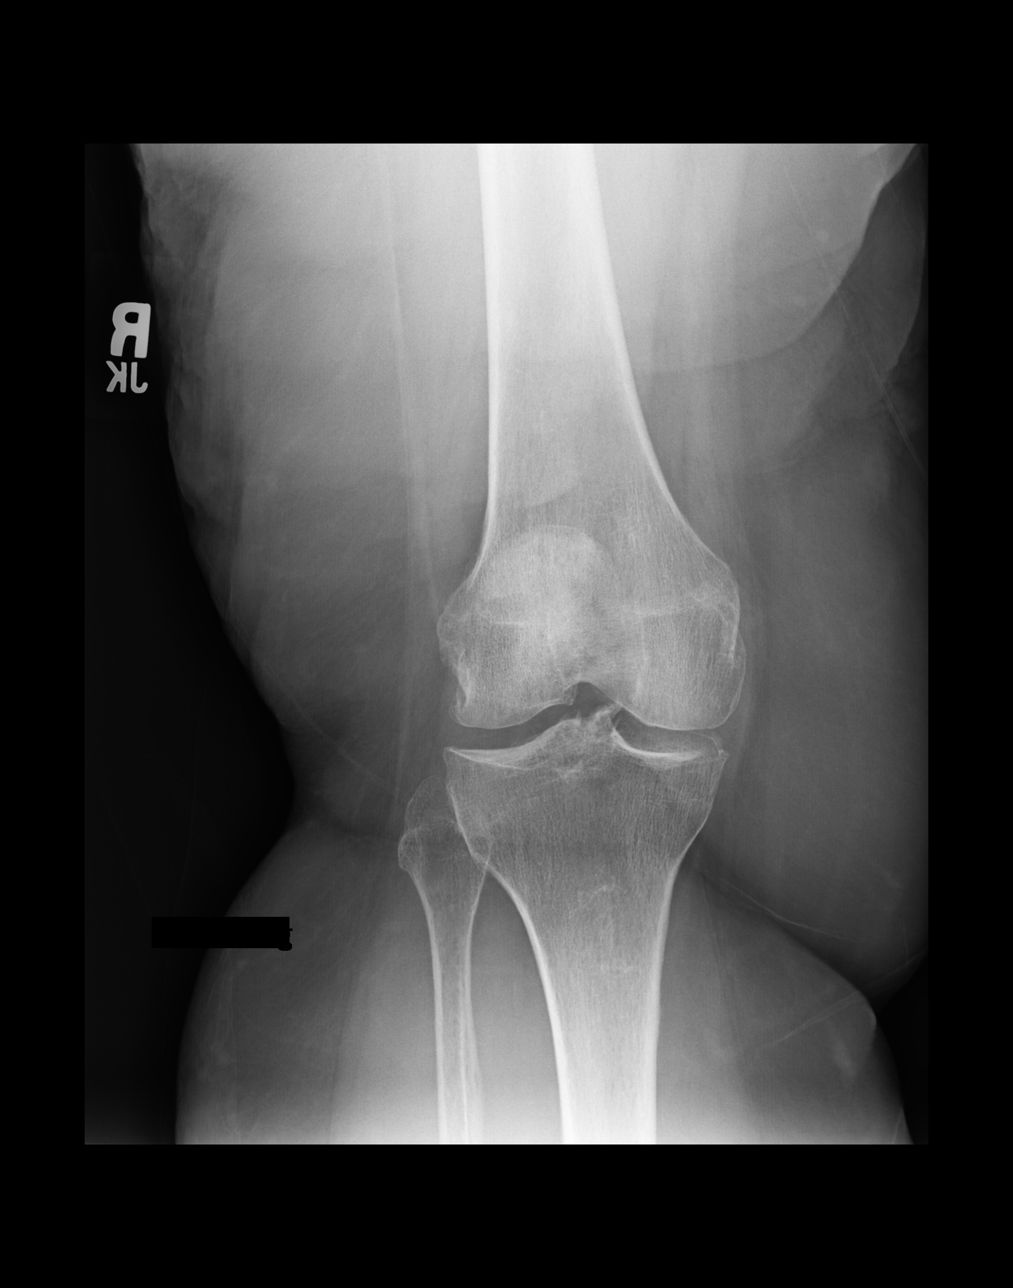

[dg knee complete 4 views right (4 of 4)]
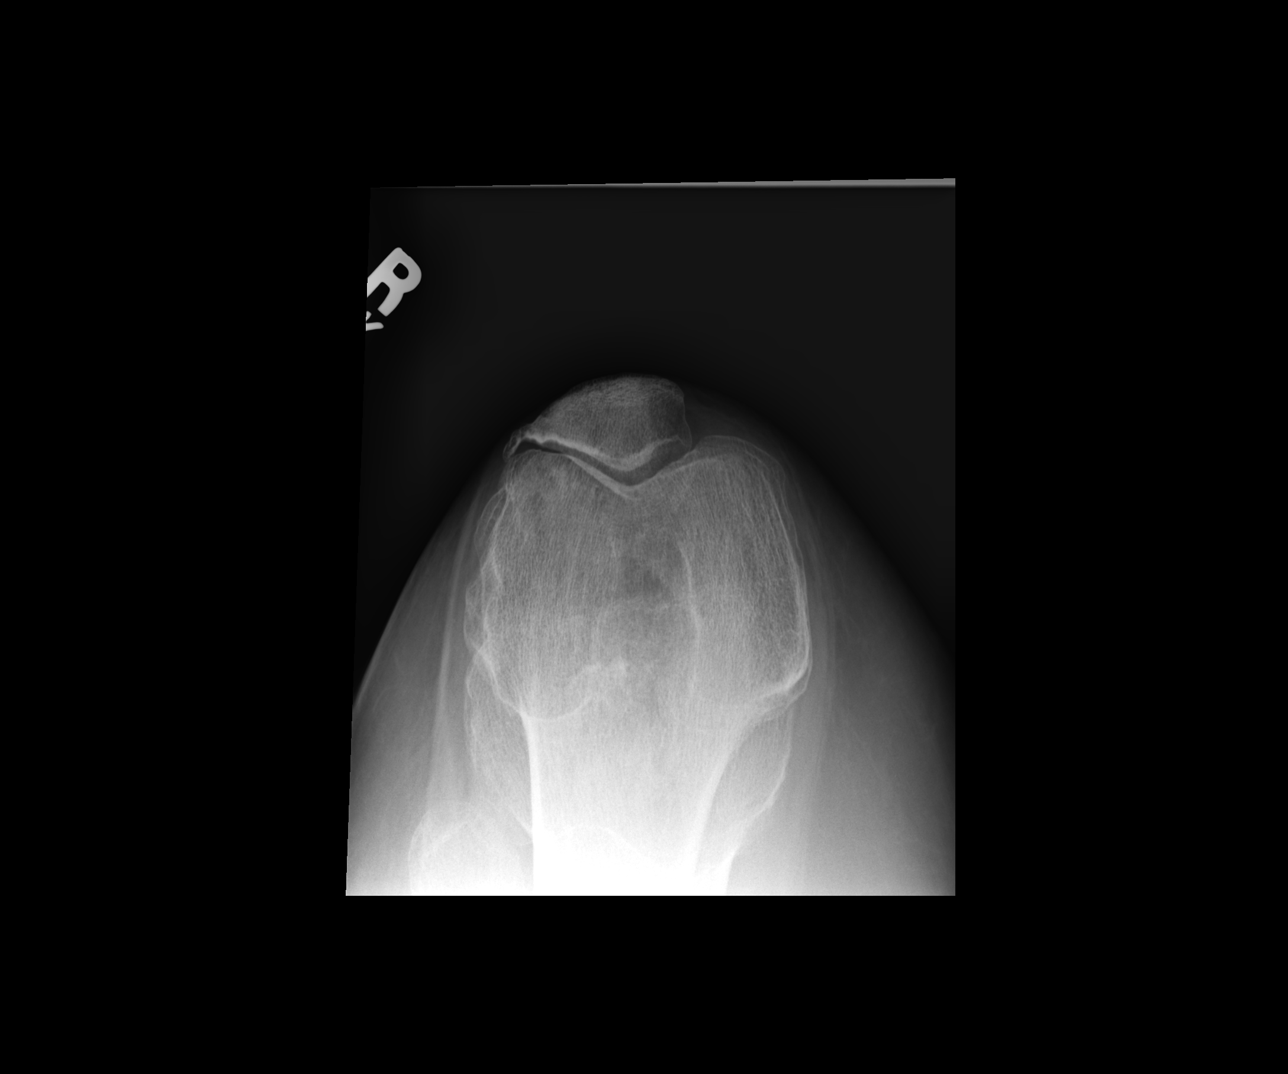

[4 of 4 positions shown; findings below may reference images not displayed]

FINDINGS: No evidence of acute or subacute fracture or dislocation. Severe
patellofemoral compartment joint space narrowing with near complete
loss of the cartilage space. Mild lateral compartment joint space
narrowing. Medial compartment joint space well-preserved. No visible
joint effusion. Lateral patellar tracking abnormality is suspected
on the patellar sunrise view which also confirms the near complete
loss of the cartilage space.
IMPRESSION: CPPD arthropathy is suspected with severe degenerative changes
involving the patellofemoral joint space.

## 2018-08-26 DIAGNOSIS — R69 Illness, unspecified: Secondary | ICD-10-CM | POA: Diagnosis not present

## 2018-09-02 DIAGNOSIS — I10 Essential (primary) hypertension: Secondary | ICD-10-CM | POA: Diagnosis not present

## 2018-09-02 DIAGNOSIS — R69 Illness, unspecified: Secondary | ICD-10-CM | POA: Diagnosis not present

## 2018-09-02 DIAGNOSIS — Z1389 Encounter for screening for other disorder: Secondary | ICD-10-CM | POA: Diagnosis not present

## 2018-09-02 DIAGNOSIS — K912 Postsurgical malabsorption, not elsewhere classified: Secondary | ICD-10-CM | POA: Diagnosis not present

## 2018-09-02 DIAGNOSIS — D649 Anemia, unspecified: Secondary | ICD-10-CM | POA: Diagnosis not present

## 2018-09-02 DIAGNOSIS — M899 Disorder of bone, unspecified: Secondary | ICD-10-CM | POA: Diagnosis not present

## 2018-09-02 DIAGNOSIS — M8580 Other specified disorders of bone density and structure, unspecified site: Secondary | ICD-10-CM | POA: Diagnosis not present

## 2018-09-02 DIAGNOSIS — K219 Gastro-esophageal reflux disease without esophagitis: Secondary | ICD-10-CM | POA: Diagnosis not present

## 2018-09-02 DIAGNOSIS — Z0000 Encounter for general adult medical examination without abnormal findings: Secondary | ICD-10-CM | POA: Diagnosis not present

## 2018-09-02 DIAGNOSIS — M1990 Unspecified osteoarthritis, unspecified site: Secondary | ICD-10-CM | POA: Diagnosis not present

## 2018-09-14 DIAGNOSIS — Z23 Encounter for immunization: Secondary | ICD-10-CM | POA: Diagnosis not present

## 2018-10-04 DIAGNOSIS — K912 Postsurgical malabsorption, not elsewhere classified: Secondary | ICD-10-CM | POA: Diagnosis not present

## 2018-10-04 DIAGNOSIS — M899 Disorder of bone, unspecified: Secondary | ICD-10-CM | POA: Diagnosis not present

## 2018-10-04 DIAGNOSIS — D649 Anemia, unspecified: Secondary | ICD-10-CM | POA: Diagnosis not present

## 2018-11-06 IMAGING — CR DG CHEST 2V
2 series · 2 of 2 positions shown · non-contrast
Comparison: None

CLINICAL DATA: Preoperative evaluation for LEFT knee replacement

EXAM:
CHEST  2 VIEW

[w chest pa]
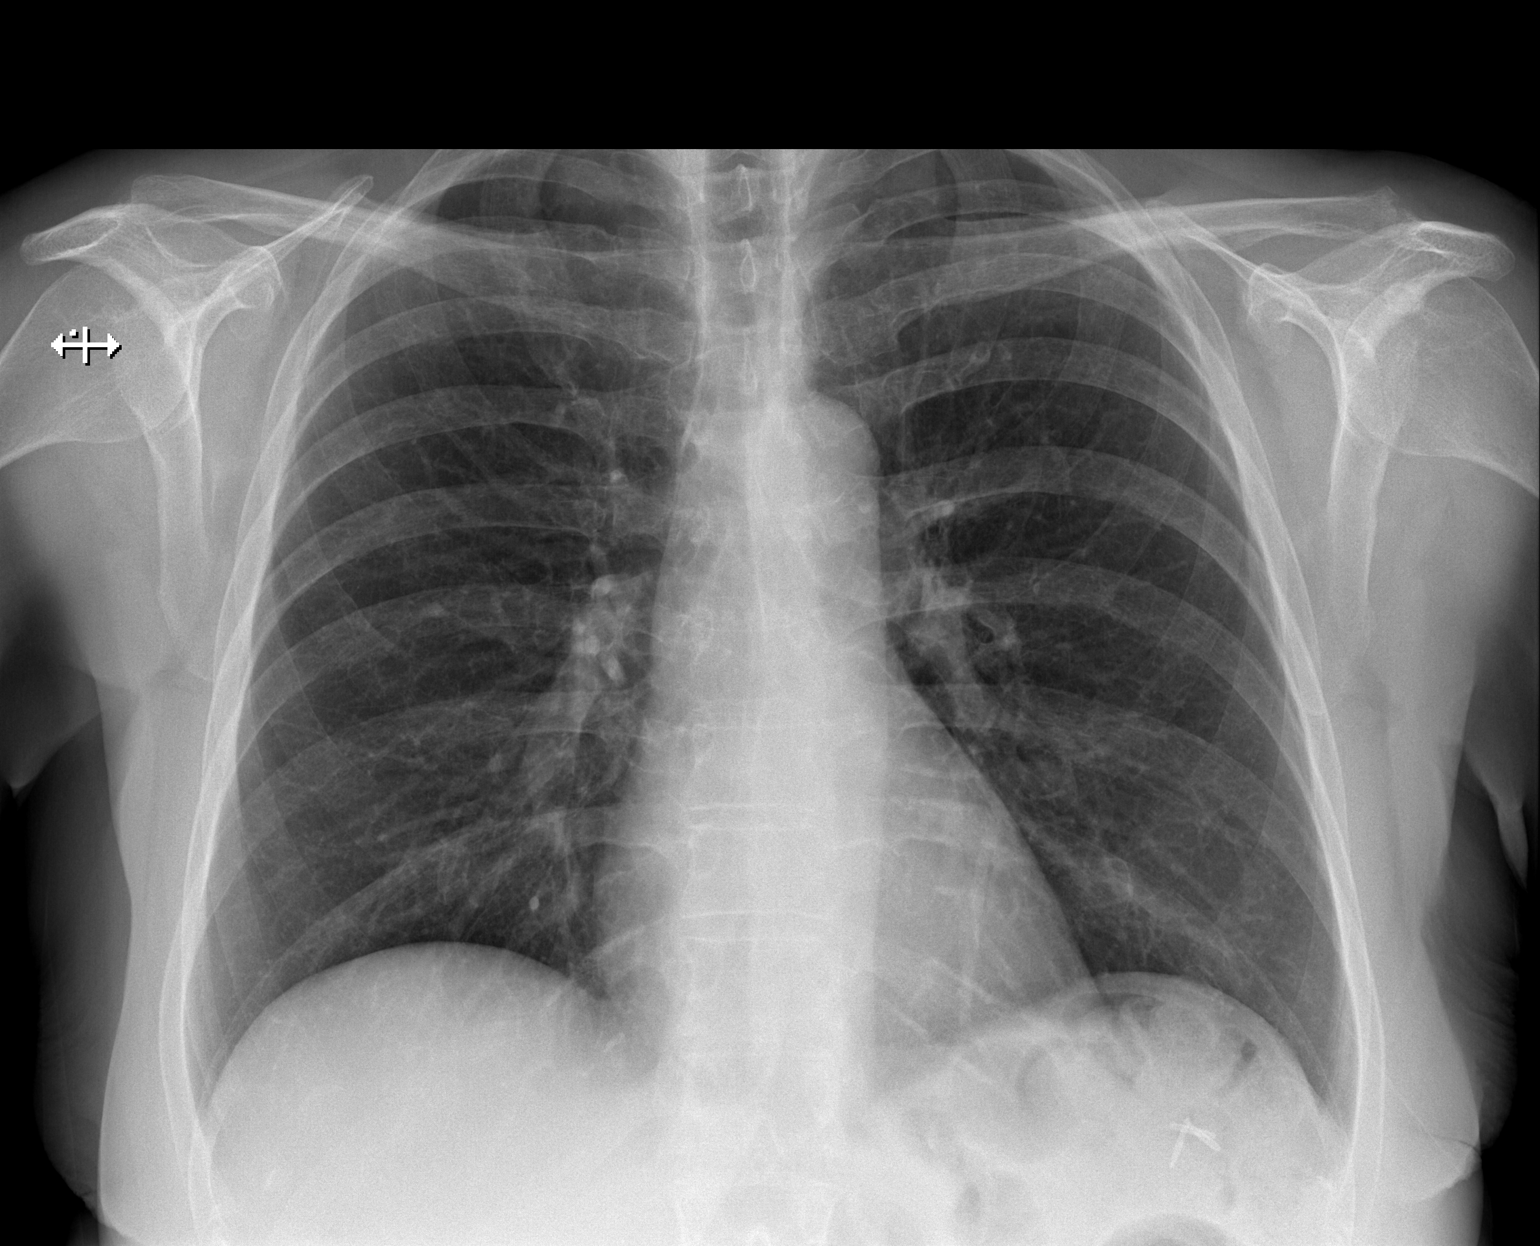

[w chest lat]
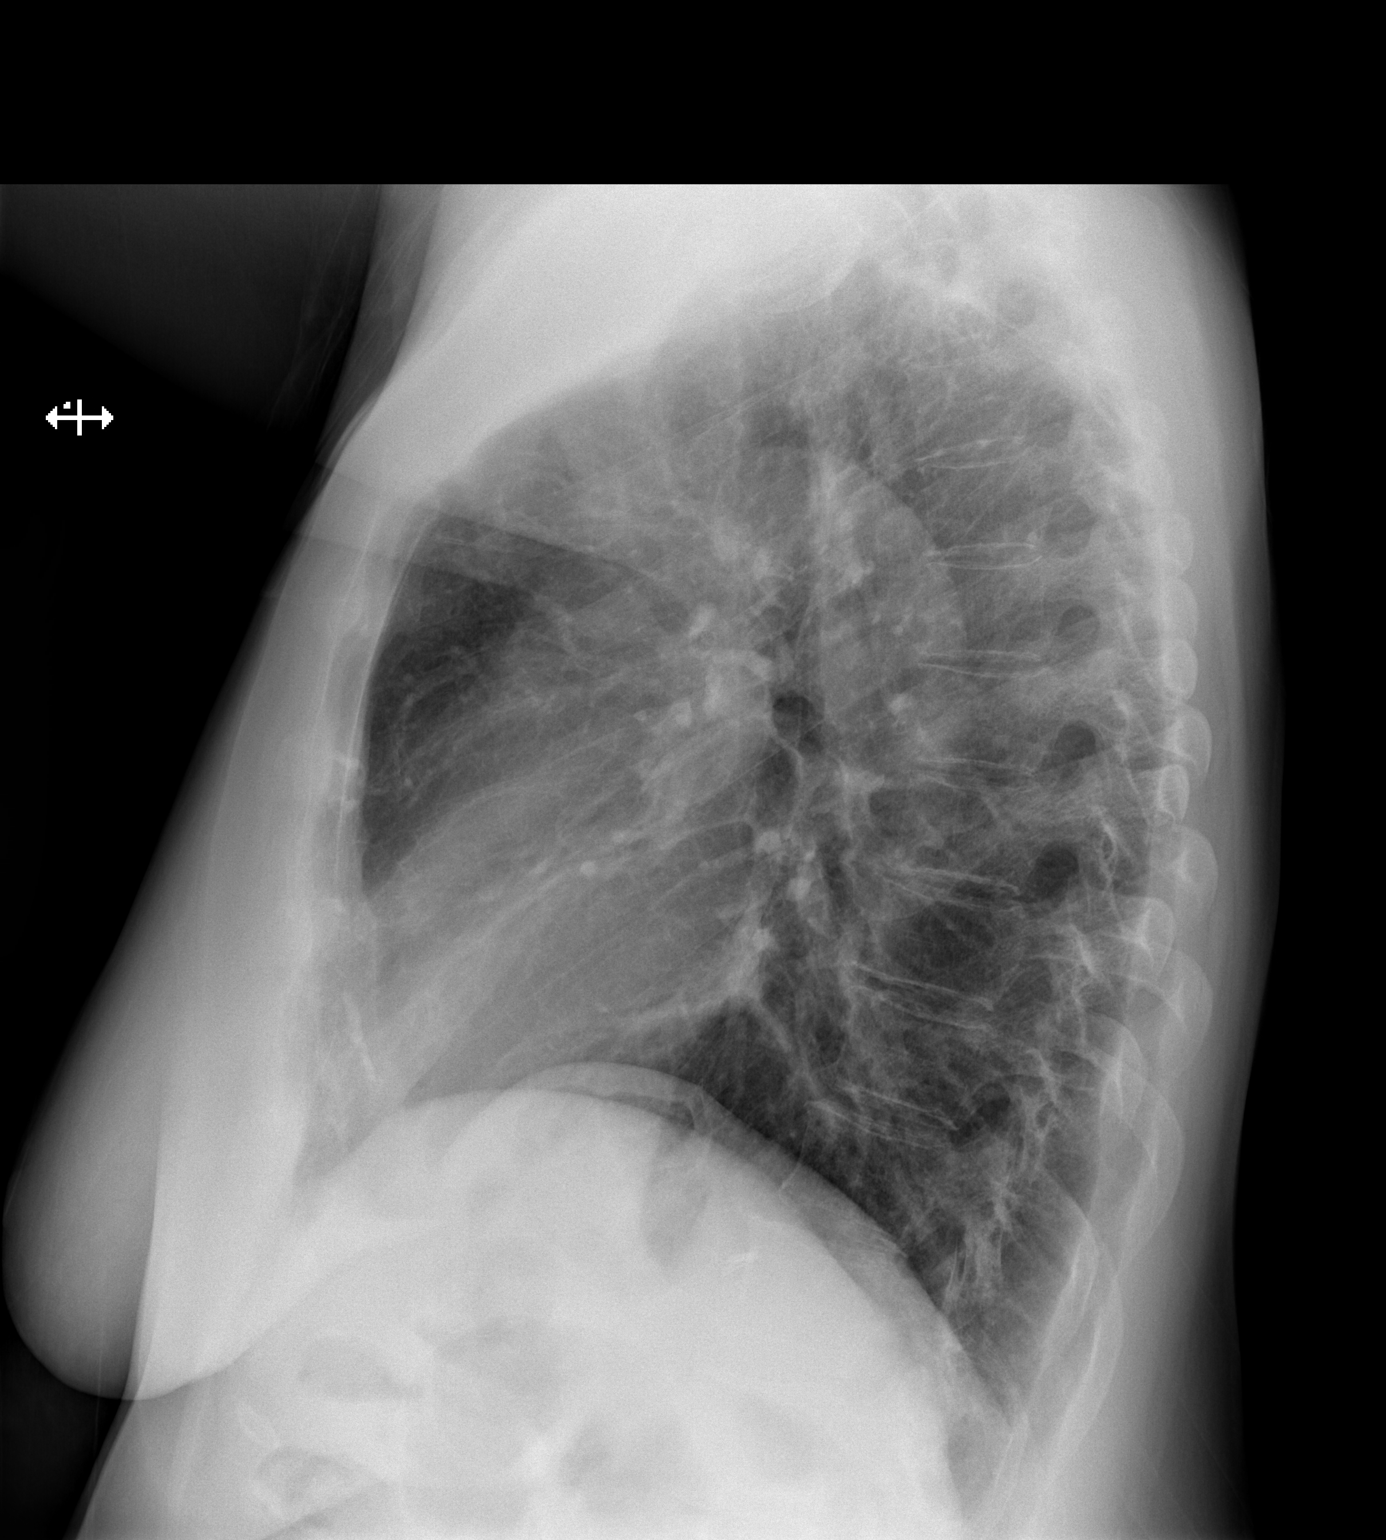

[2 of 2 positions shown; findings below may reference images not displayed]

FINDINGS: Normal heart size, mediastinal contours, and pulmonary vascularity.

Lungs clear.

No pleural effusion or pneumothorax.

Bones unremarkable.

LEFT upper quadrant surgical clips noted.
IMPRESSION: No acute abnormalities.

## 2018-11-21 DIAGNOSIS — Z119 Encounter for screening for infectious and parasitic diseases, unspecified: Secondary | ICD-10-CM | POA: Diagnosis not present

## 2018-12-23 DIAGNOSIS — Z1211 Encounter for screening for malignant neoplasm of colon: Secondary | ICD-10-CM | POA: Diagnosis not present

## 2019-01-01 ENCOUNTER — Ambulatory Visit: Payer: Medicare HMO | Attending: Internal Medicine

## 2019-01-01 DIAGNOSIS — Z20828 Contact with and (suspected) exposure to other viral communicable diseases: Secondary | ICD-10-CM | POA: Diagnosis not present

## 2019-01-01 DIAGNOSIS — Z20822 Contact with and (suspected) exposure to covid-19: Secondary | ICD-10-CM

## 2019-01-02 LAB — NOVEL CORONAVIRUS, NAA: SARS-CoV-2, NAA: NOT DETECTED

## 2019-01-15 DIAGNOSIS — K219 Gastro-esophageal reflux disease without esophagitis: Secondary | ICD-10-CM | POA: Diagnosis not present

## 2019-02-01 ENCOUNTER — Ambulatory Visit: Payer: Medicare HMO

## 2019-02-06 ENCOUNTER — Ambulatory Visit: Payer: Medicare HMO

## 2019-03-04 DIAGNOSIS — R69 Illness, unspecified: Secondary | ICD-10-CM | POA: Diagnosis not present

## 2019-03-11 DIAGNOSIS — M8580 Other specified disorders of bone density and structure, unspecified site: Secondary | ICD-10-CM | POA: Diagnosis not present

## 2019-03-11 DIAGNOSIS — H6120 Impacted cerumen, unspecified ear: Secondary | ICD-10-CM | POA: Diagnosis not present

## 2019-03-11 DIAGNOSIS — K219 Gastro-esophageal reflux disease without esophagitis: Secondary | ICD-10-CM | POA: Diagnosis not present

## 2019-03-11 DIAGNOSIS — E538 Deficiency of other specified B group vitamins: Secondary | ICD-10-CM | POA: Diagnosis not present

## 2019-03-11 DIAGNOSIS — K912 Postsurgical malabsorption, not elsewhere classified: Secondary | ICD-10-CM | POA: Diagnosis not present

## 2019-03-11 DIAGNOSIS — M899 Disorder of bone, unspecified: Secondary | ICD-10-CM | POA: Diagnosis not present

## 2019-03-11 DIAGNOSIS — D649 Anemia, unspecified: Secondary | ICD-10-CM | POA: Diagnosis not present

## 2019-03-11 DIAGNOSIS — I10 Essential (primary) hypertension: Secondary | ICD-10-CM | POA: Diagnosis not present

## 2019-06-17 ENCOUNTER — Other Ambulatory Visit: Payer: Self-pay | Admitting: Orthopedic Surgery

## 2019-06-17 ENCOUNTER — Other Ambulatory Visit (HOSPITAL_COMMUNITY): Payer: Self-pay | Admitting: Orthopedic Surgery

## 2019-06-17 DIAGNOSIS — Z96652 Presence of left artificial knee joint: Secondary | ICD-10-CM

## 2019-06-17 DIAGNOSIS — M25562 Pain in left knee: Secondary | ICD-10-CM | POA: Diagnosis not present

## 2019-06-17 DIAGNOSIS — T8484XA Pain due to internal orthopedic prosthetic devices, implants and grafts, initial encounter: Secondary | ICD-10-CM | POA: Diagnosis not present

## 2019-06-27 ENCOUNTER — Other Ambulatory Visit: Payer: Self-pay

## 2019-06-27 ENCOUNTER — Encounter (HOSPITAL_COMMUNITY)
Admission: RE | Admit: 2019-06-27 | Discharge: 2019-06-27 | Disposition: A | Discharge status: Home / Self Care | Payer: Medicare HMO | Source: Ambulatory Visit | Attending: Orthopedic Surgery | Admitting: Orthopedic Surgery

## 2019-06-27 DIAGNOSIS — Z96652 Presence of left artificial knee joint: Secondary | ICD-10-CM | POA: Insufficient documentation

## 2019-06-27 DIAGNOSIS — M25562 Pain in left knee: Secondary | ICD-10-CM | POA: Diagnosis not present

## 2019-06-27 MED ORDER — TECHNETIUM TC 99M MEDRONATE IV KIT
20.0000 | PACK | Freq: Once | INTRAVENOUS | Status: AC | PRN
  Administered 2019-06-27: 20 via INTRAVENOUS

## 2019-07-09 DIAGNOSIS — L570 Actinic keratosis: Secondary | ICD-10-CM | POA: Diagnosis not present

## 2019-07-09 DIAGNOSIS — L820 Inflamed seborrheic keratosis: Secondary | ICD-10-CM | POA: Diagnosis not present

## 2019-07-09 DIAGNOSIS — D1801 Hemangioma of skin and subcutaneous tissue: Secondary | ICD-10-CM | POA: Diagnosis not present

## 2019-07-09 DIAGNOSIS — L814 Other melanin hyperpigmentation: Secondary | ICD-10-CM | POA: Diagnosis not present

## 2019-07-10 DIAGNOSIS — Z471 Aftercare following joint replacement surgery: Secondary | ICD-10-CM | POA: Diagnosis not present

## 2019-07-10 DIAGNOSIS — Z96652 Presence of left artificial knee joint: Secondary | ICD-10-CM | POA: Diagnosis not present

## 2019-07-29 DIAGNOSIS — H524 Presbyopia: Secondary | ICD-10-CM | POA: Diagnosis not present

## 2019-07-29 DIAGNOSIS — Z961 Presence of intraocular lens: Secondary | ICD-10-CM | POA: Diagnosis not present

## 2019-08-01 ENCOUNTER — Other Ambulatory Visit: Payer: Self-pay | Admitting: Orthopedic Surgery

## 2019-08-05 DIAGNOSIS — H52223 Regular astigmatism, bilateral: Secondary | ICD-10-CM | POA: Diagnosis not present

## 2019-08-05 DIAGNOSIS — H524 Presbyopia: Secondary | ICD-10-CM | POA: Diagnosis not present

## 2019-08-13 DIAGNOSIS — Z1231 Encounter for screening mammogram for malignant neoplasm of breast: Secondary | ICD-10-CM | POA: Diagnosis not present

## 2019-08-13 NOTE — Patient Instructions (Addendum)
DUE TO COVID-19 ONLY ONE VISITOR IS ALLOWED TO COME WITH YOU AND STAY IN THE WAITING ROOM ONLY DURING PRE OP AND PROCEDURE DAY OF SURGERY. THE 1 VISITOR  MAY VISIT WITH YOU AFTER SURGERY IN YOUR PRIVATE ROOM DURING VISITING HOURS ONLY!  YOU NEED TO HAVE A COVID 19 TEST ON: 08/21/19 @ 9:00 am, THIS TEST MUST BE DONE BEFORE SURGERY,  COVID TESTING SITE [place] [address], IT IS ON THE RIGHT GOING OUT [address] APPROXIMATELY  2 MINUTES PAST [employer] ON THE RIGHT. ONCE YOUR COVID TEST IS COMPLETED,  PLEASE BEGIN THE QUARANTINE INSTRUCTIONS AS OUTLINED IN YOUR HANDOUT.                Laura Bean   Your procedure is scheduled on: 08/25/19   Report to [facility] Main  Entrance   Report to admitting at: 7:00 AM     Call this number if you have problems the morning of surgery [phone]    Remember:   NO SOLID FOOD AFTER MIDNIGHT THE NIGHT PRIOR TO SURGERY. NOTHING BY MOUTH EXCEPT CLEAR LIQUIDS UNTIL: 6:30 am . PLEASE FINISH ENSURE DRINK PER SURGEON ORDER  WHICH NEEDS TO BE COMPLETED AT: 6:30 am .  CLEAR LIQUID DIET   Foods Allowed                                                                     Foods Excluded  Coffee and tea, regular and decaf                             liquids that you cannot  Plain Jell-O any favor except red or purple                                           see through such as: Fruit ices (not with fruit pulp)                                     milk, soups, orange juice  Iced Popsicles                                    All solid food Carbonated beverages, regular and diet                                    Cranberry, grape and apple juices Sports drinks like Gatorade Lightly seasoned clear broth or consume(fat free) Sugar, honey syrup  Sample Menu Breakfast                                Lunch  Supper Cranberry juice                    Beef broth                             Chicken broth Jell-O                                     Grape juice                           Apple juice Coffee or tea                        Jell-O                                      Popsicle                                                Coffee or tea                        Coffee or tea  _____________________________________________________________________   BRUSH YOUR TEETH MORNING OF SURGERY AND RINSE YOUR MOUTH OUT, NO CHEWING GUM CANDY OR MINTS.     Take these medicines the morning of surgery with A SIP OF WATER: omeprazole.Use afrin as usual.                                You may not have any metal on your body including hair pins and              piercings  Do not wear jewelry, make-up, lotions, powders or perfumes, deodorant             Do not wear nail polish on your fingernails.  Do not shave  48 hours prior to surgery.         Do not bring valuables to the hospital. [place].  Contacts, dentures or bridgework may not be worn into surgery.  Leave suitcase in the car. After surgery it may be brought to your room.     Patients discharged the day of surgery will not be allowed to drive home. IF YOU ARE HAVING SURGERY AND GOING HOME THE SAME DAY, YOU MUST HAVE AN ADULT TO DRIVE YOU HOME AND BE WITH YOU FOR 24 HOURS. YOU MAY GO HOME BY TAXI OR UBER OR ORTHERWISE, BUT AN ADULT MUST ACCOMPANY YOU HOME AND STAY WITH YOU FOR 24 HOURS.  Name and phone number of your driver:  Special Instructions: N/A              Please read over the following fact sheets you were given: _____________________________________________________________________        [facility] - Preparing for Surgery Before surgery, you can play an important role.  Because skin is not sterile, your skin needs to be  as free of germs as possible.  You can reduce the number of germs on your skin by washing with CHG (chlorahexidine gluconate) soap before surgery.  CHG  is an antiseptic cleaner which kills germs and bonds with the skin to continue killing germs even after washing. Please DO NOT use if you have an allergy to CHG or antibacterial soaps.  If your skin becomes reddened/irritated stop using the CHG and inform your nurse when you arrive at Short Stay. Do not shave (including legs and underarms) for at least 48 hours prior to the first CHG shower.  You may shave your face/neck. Please follow these instructions carefully:  1.  Shower with CHG Soap the night before surgery and the  morning of Surgery.  2.  If you choose to wash your hair, wash your hair first as usual with your  normal  shampoo.  3.  After you shampoo, rinse your hair and body thoroughly to remove the  shampoo.                           4.  Use CHG as you would any other liquid soap.  You can apply chg directly  to the skin and wash                       Gently with a scrungie or clean washcloth.  5.  Apply the CHG Soap to your body ONLY FROM THE NECK DOWN.   Do not use on face/ open                           Wound or open sores. Avoid contact with eyes, ears mouth and genitals (private parts).                       Wash face,  Genitals (private parts) with your normal soap.             6.  Wash thoroughly, paying special attention to the area where your surgery  will be performed.  7.  Thoroughly rinse your body with warm water from the neck down.  8.  DO NOT shower/wash with your normal soap after using and rinsing off  the CHG Soap.                9.  Pat yourself dry with a clean towel.            10.  Wear clean pajamas.            11.  Place clean sheets on your bed the night of your first shower and do not  sleep with pets. Day of Surgery : Do not apply any lotions/deodorants the morning of surgery.  Please wear clean clothes to the hospital/surgery center.  FAILURE TO FOLLOW THESE INSTRUCTIONS MAY RESULT IN THE CANCELLATION OF YOUR SURGERY PATIENT  SIGNATURE_________________________________  NURSE SIGNATURE__________________________________  ________________________________________________________________________   Laura Bean  An incentive spirometer is a tool that can help keep your lungs clear and active. This tool measures how well you are filling your lungs with each breath. Taking long deep breaths may help reverse or decrease the chance of developing breathing (pulmonary) problems (especially infection) following:  A long period of time when you are unable to move or be active. BEFORE THE PROCEDURE   If the spirometer includes an indicator to show your best  effort, your nurse or respiratory therapist will set it to a desired goal.  If possible, sit up straight or lean slightly forward. Try not to slouch.  Hold the incentive spirometer in an upright position. INSTRUCTIONS FOR USE  1. Sit on the edge of your bed if possible, or sit up as far as you can in bed or on a chair. 2. Hold the incentive spirometer in an upright position. 3. Breathe out normally. 4. Place the mouthpiece in your mouth and seal your lips tightly around it. 5. Breathe in slowly and as deeply as possible, raising the piston or the ball toward the top of the column. 6. Hold your breath for 3-5 seconds or for as long as possible. Allow the piston or ball to fall to the bottom of the column. 7. Remove the mouthpiece from your mouth and breathe out normally. 8. Rest for a few seconds and repeat Steps 1 through 7 at least 10 times every 1-2 hours when you are awake. Take your time and take a few normal breaths between deep breaths. 9. The spirometer may include an indicator to show your best effort. Use the indicator as a goal to work toward during each repetition. 10. After each set of 10 deep breaths, practice coughing to be sure your lungs are clear. If you have an incision (the cut made at the time of surgery), support your incision when coughing  by placing a pillow or rolled up towels firmly against it. Once you are able to get out of bed, walk around indoors and cough well. You may stop using the incentive spirometer when instructed by your caregiver.  RISKS AND COMPLICATIONS  Take your time so you do not get dizzy or light-headed.  If you are in pain, you may need to take or ask for pain medication before doing incentive spirometry. It is harder to take a deep breath if you are having pain. AFTER USE  Rest and breathe slowly and easily.  It can be helpful to keep track of a log of your progress. Your caregiver can provide you with a simple table to help with this. If you are using the spirometer at home, follow these instructions: Laura Bean IF:   You are having difficultly using the spirometer.  You have trouble using the spirometer as often as instructed.  Your pain medication is not giving enough relief while using the spirometer.  You develop fever of 100.5 F (38.1 C) or higher. SEEK IMMEDIATE MEDICAL CARE IF:   You cough up bloody sputum that had not been present before.  You develop fever of 102 F (38.9 C) or greater.  You develop worsening pain at or near the incision site. MAKE SURE YOU:   Understand these instructions.  Will watch your condition.  Will get help right away if you are not doing well or get worse. Document Released: 05/01/2006 Document Revised: 03/13/2011 Document Reviewed: 07/02/2006 [facility] Patient Information 2014 [place], [place].   ________________________________________________________________________

## 2019-08-14 ENCOUNTER — Ambulatory Visit (HOSPITAL_COMMUNITY)
Admission: RE | Admit: 2019-08-14 | Discharge: 2019-08-14 | Disposition: A | Discharge status: Home / Self Care | Payer: Medicare HMO | Source: Ambulatory Visit | Attending: Orthopedic Surgery | Admitting: Orthopedic Surgery

## 2019-08-14 ENCOUNTER — Encounter (HOSPITAL_COMMUNITY): Payer: Self-pay

## 2019-08-14 ENCOUNTER — Other Ambulatory Visit: Payer: Self-pay

## 2019-08-14 ENCOUNTER — Encounter (HOSPITAL_COMMUNITY)
Admission: RE | Admit: 2019-08-14 | Discharge: 2019-08-14 | Disposition: A | Discharge status: Home / Self Care | Payer: Medicare HMO | Source: Ambulatory Visit | Attending: Orthopedic Surgery | Admitting: Orthopedic Surgery

## 2019-08-14 DIAGNOSIS — Z01818 Encounter for other preprocedural examination: Secondary | ICD-10-CM | POA: Diagnosis not present

## 2019-08-14 HISTORY — DX: Depression, unspecified: F32.A

## 2019-08-14 LAB — BASIC METABOLIC PANEL
Anion gap: 7 (ref 5–15)
BUN: 27 mg/dL — ABNORMAL HIGH (ref 8–23)
CO2: 30 mmol/L (ref 22–32)
Calcium: 9.9 mg/dL (ref 8.9–10.3)
Chloride: 104 mmol/L (ref 98–111)
Creatinine, Ser: 1.04 mg/dL — ABNORMAL HIGH (ref 0.44–1.00)
GFR calc Af Amer: >60 mL/min (ref >60)
GFR calc non Af Amer: 55 mL/min — ABNORMAL LOW (ref >60)
Glucose, Bld: 104 mg/dL — ABNORMAL HIGH (ref 70–99)
Potassium: 4.9 mmol/L (ref 3.5–5.1)
Sodium: 141 mmol/L (ref 135–145)

## 2019-08-14 LAB — URINALYSIS, ROUTINE W REFLEX MICROSCOPIC
Bacteria, UA: NONE SEEN
Bilirubin Urine: NEGATIVE
Glucose, UA: NEGATIVE mg/dL
Hgb urine dipstick: NEGATIVE
Ketones, ur: NEGATIVE mg/dL
Nitrite: NEGATIVE
Protein, ur: NEGATIVE mg/dL
Specific Gravity, Urine: 1.013 (ref 1.005–1.030)
pH: 7 (ref 5.0–8.0)

## 2019-08-14 LAB — TYPE AND SCREEN
ABO/RH(D): O NEG
Antibody Screen: NEGATIVE

## 2019-08-14 LAB — CBC WITH DIFFERENTIAL/PLATELET
Abs Immature Granulocytes: 0.03 10*3/uL (ref 0.00–0.07)
Basophils Absolute: 0 10*3/uL (ref 0.0–0.1)
Basophils Relative: 1 %
Eosinophils Absolute: 0.1 10*3/uL (ref 0.0–0.5)
Eosinophils Relative: 2 %
HCT: 39.9 % (ref 36.0–46.0)
Hemoglobin: 13 g/dL (ref 12.0–15.0)
Immature Granulocytes: 1 %
Lymphocytes Relative: 22 %
Lymphs Abs: 1.4 10*3/uL (ref 0.7–4.0)
MCH: 30.9 pg (ref 26.0–34.0)
MCHC: 32.6 g/dL (ref 30.0–36.0)
MCV: 94.8 fL (ref 80.0–100.0)
Monocytes Absolute: 0.6 10*3/uL (ref 0.1–1.0)
Monocytes Relative: 10 %
Neutro Abs: 4 10*3/uL (ref 1.7–7.7)
Neutrophils Relative %: 64 %
Platelets: 253 10*3/uL (ref 150–400)
RBC: 4.21 MIL/uL (ref 3.87–5.11)
RDW: 12.5 % (ref 11.5–15.5)
WBC: 6.2 10*3/uL (ref 4.0–10.5)
nRBC: 0 % (ref 0.0–0.2)

## 2019-08-14 LAB — APTT: aPTT: 29 seconds (ref 24–36)

## 2019-08-14 LAB — SURGICAL PCR SCREEN
MRSA, PCR: NEGATIVE
Staphylococcus aureus: NEGATIVE

## 2019-08-14 LAB — PROTIME-INR
INR: 1 (ref 0.8–1.2)
Prothrombin Time: 12.5 seconds (ref 11.4–15.2)

## 2019-08-14 NOTE — Progress Notes (Addendum)
COVID Vaccine Completed:Yes Date COVID Vaccine completed: 02/14/19 COVID vaccine manufacturer: *[place]   PCP - Dr.Robert Ehinger. Cardiologist - NO. Dr. Aaron Edelman [place](08/10/2016). Consulted for abnormal EKG: atrial premature beat.  Chest x-ray -  EKG -  Stress Test -  ECHO -  Cardiac Cath -   Sleep Study -  CPAP -   Fasting Blood Sugar -  Checks Blood Sugar _____ times a day  Blood Thinner Instructions: Aspirin Instructions: Last Dose:  Anesthesia review:   Patient denies shortness of breath, fever, cough and chest pain at PAT appointment   Patient verbalized understanding of instructions that were given to them at the PAT appointment. Patient was also instructed that they will need to review over the PAT instructions again at home before surgery.

## 2019-08-20 DIAGNOSIS — Z96652 Presence of left artificial knee joint: Secondary | ICD-10-CM | POA: Diagnosis present

## 2019-08-20 DIAGNOSIS — T8484XA Pain due to internal orthopedic prosthetic devices, implants and grafts, initial encounter: Secondary | ICD-10-CM | POA: Diagnosis present

## 2019-08-20 NOTE — H&P (Signed)
TOTAL KNEE REVISION ADMISSION H&P  Patient is being admitted for left revision total knee arthroplasty.  Subjective:  Chief Complaint:left knee pain.  HPI: Laura Bean, 69 y.o. female, has a history of pain and functional disability in the left knee(s) due to failed previous arthroplasty and patient has failed non-surgical conservative treatments for greater than 12 weeks to include NSAID's and/or analgesics, use of assistive devices, weight reduction as appropriate and activity modification. The indications for the revision of the total knee arthroplasty are loosening of one or more components. Onset of symptoms was gradual starting 2 years ago with gradually worsening course since that time.  Prior procedures on the left knee(s) include arthroplasty.  Patient currently rates pain in the left knee(s) at 10 out of 10 with activity. There is night pain, worsening of pain with activity and weight bearing, pain that interferes with activities of daily living and pain with passive range of motion.  Patient has evidence of prosthetic loosening by imaging studies. This condition presents safety issues increasing the risk of falls.  There is no current active infection.  Patient Active Problem List   Diagnosis Date Noted  . 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 Primary osteoarthritis of left knee 08/14/2016  . Degenerative arthritis of left knee 08/11/2016   Past Medical History:  Diagnosis Date  . Arthritis   . Depression   . Family history of adverse reaction to anesthesia    ? father confusion with anesthesia when older  . GERD (gastroesophageal reflux disease)    occ  . History of kidney stones   . Hypertension     Past Surgical History:  Procedure Laterality Date  . ADENOIDECTOMY     child  . EYE SURGERY     cataract  . ROUX-EN-Y GASTRIC BYPASS  2002  . SINUS EXPLORATION    . THIGH LIFT     outer  . TOTAL KNEE ARTHROPLASTY Left 08/14/2016   Procedure: TOTAL  KNEE ARTHROPLASTY;  Surgeon: Frederik Pear, MD;  Location: [place];  Service: Orthopedics;  Laterality: Left;  . TRIGGER FINGER RELEASE Right 11/01/2017   Procedure: RIGHT THUMB TRIGGER RELEASE;  Surgeon: [name], MD;  Location: [place];  Service: Orthopedics;  Laterality: Right;    No current facility-administered medications for this encounter.   Current Outpatient Medications  Medication Sig Dispense Refill Last Dose  . acetaminophen (TYLENOL) 500 MG tablet Take 500 mg by mouth every 6 (six) hours as needed for moderate pain or headache.     . Ashwagandha 125 MG CAPS Take 125 mg by mouth in the morning and at bedtime.     . calcium carbonate (OSCAL) 1500 (600 Ca) MG TABS tablet Take 600 mg of elemental calcium by mouth 2 (two) times daily with a meal.      . cholecalciferol (VITAMIN D) 1000 units tablet Take 1,000 Units by mouth daily.      . diphenhydramine-acetaminophen (TYLENOL PM) 25-500 MG TABS tablet Take 1 tablet by mouth at bedtime as needed (sleep).      . fluoruracil (CARAC) 0.5 % cream Apply 1 application topically 2 (two) times daily.     . Glucosamine 500 MG CAPS Take 500 mg by mouth daily.      . Melatonin 10 MG TABS Take 10 mg by mouth at bedtime.     . Multiple Vitamin (MULTIVITAMIN) capsule Take 1 capsule by mouth daily.     Marland Kitchen omeprazole (PRILOSEC) 20 MG capsule Take 20  mg by mouth daily as needed (reflux).     Marland Kitchen oxymetazoline (AFRIN) 0.05 % nasal spray Place 1 spray into both nostrils daily as needed for congestion.     . valsartan-hydrochlorothiazide (DIOVAN-HCT) 80-12.5 MG tablet Take 1 tablet by mouth at bedtime.      . vitamin B-12 (CYANOCOBALAMIN) 1000 MCG tablet Take 1,000 mcg by mouth daily.      Marland Kitchen HYDROcodone-acetaminophen (NORCO) 5-325 MG tablet 1-2 tabs po q6 hours prn pain (Patient not taking: Reported on 08/04/2019) 20 tablet 0 Not Taking at Unknown time   Allergies  Allergen Reactions  . Lisinopril Cough    Social History   Tobacco Use   . Smoking status: Former Smoker    Quit date: 08/08/2000    Years since quitting: 19.0  . Smokeless tobacco: Never Used  Substance Use Topics  . Alcohol use: Yes    Comment: rarely    Family History  Problem Relation Age of Onset  . Aortic stenosis Mother   . Arrhythmia Mother   . Aortic stenosis Father       Review of Systems  Constitutional: Negative.   HENT: Positive for tinnitus.   Eyes: Negative.   Respiratory: Negative.   Cardiovascular:       HTN  Gastrointestinal: Negative.        GERD  Endocrine: Negative.   Genitourinary: Negative.   Musculoskeletal: Positive for arthralgias.  Allergic/Immunologic: Negative.   Neurological: Negative.   Hematological: Negative.   Psychiatric/Behavioral: Negative.      Objective:  Physical Exam Constitutional:      Appearance: Normal appearance. She is obese.  HENT:     Head: Normocephalic and atraumatic.     Nose: Nose normal.  Eyes:     Pupils: Pupils are equal, round, and reactive to light.  Cardiovascular:     Pulses: Normal pulses.  Pulmonary:     Effort: Pulmonary effort is normal.  Musculoskeletal:        General: Tenderness present.     Cervical back: Normal range of motion and neck supple.     Comments: Exam of the knee is unchanged range of motion 0-120 no palpable effusion collateral ligaments are stable but she is a little tender along the proximal flare of the tibia medially greater than laterally.    Skin:    General: Skin is warm and dry.  Neurological:     General: No focal deficit present.     Mental Status: She is alert and oriented to person, place, and time.  Psychiatric:        Mood and Affect: Mood normal.        Behavior: Behavior normal.        Thought Content: Thought content normal.        Judgment: Judgment normal.     Vital signs in last 24 hours:    Labs:  Estimated body mass index is 30.55 kg/m as calculated from the following:   Height as of 08/14/19: 5\' 4"  (1.626 m).    Weight as of 08/14/19: 80.7 kg.  Imaging Review The bone scan images reviewed in detail with the patient do show increased uptake on the tibial implant with a normal femoral implant.   Assessment/Plan:  End stage arthritis, left knee(s) with failed previous arthroplasty.   The patient history, physical examination, clinical judgment of the provider and imaging studies are consistent with end stage degenerative joint disease of the left knee(s), previous total knee arthroplasty. Revision total knee arthroplasty  is deemed medically necessary. The treatment options including medical management, injection therapy, arthroscopy and revision arthroplasty were discussed at length. The risks and benefits of revision total knee arthroplasty were presented and reviewed. The risks due to aseptic loosening, infection, stiffness, patella tracking problems, thromboembolic complications and other imponderables were discussed. The patient acknowledged the explanation, agreed to proceed with the plan and consent was signed. Patient is being admitted for inpatient treatment for surgery, pain control, PT, OT, prophylactic antibiotics, VTE prophylaxis, progressive ambulation and ADL's and discharge planning.The patient is planning to be discharged home with home health services

## 2019-08-21 ENCOUNTER — Other Ambulatory Visit (HOSPITAL_COMMUNITY)
Admission: RE | Admit: 2019-08-21 | Discharge: 2019-08-21 | Disposition: A | Discharge status: Home / Self Care | Payer: Medicare HMO | Source: Ambulatory Visit | Attending: Orthopedic Surgery | Admitting: Orthopedic Surgery

## 2019-08-21 DIAGNOSIS — Z01812 Encounter for preprocedural laboratory examination: Secondary | ICD-10-CM | POA: Insufficient documentation

## 2019-08-21 DIAGNOSIS — Z20822 Contact with and (suspected) exposure to covid-19: Secondary | ICD-10-CM | POA: Diagnosis not present

## 2019-08-21 LAB — SARS CORONAVIRUS 2 (TAT 6-24 HRS): SARS Coronavirus 2: NEGATIVE

## 2019-08-22 NOTE — Progress Notes (Signed)
Pt made aware that her scheduled surgery on 08-25-19 is now 8:40 AM in lieu of 9:30 AM. Pt to report to Admitting at 6:10 AM, and finish the Ensure supplement by 5:40 AM. Pt verbalized understanding.

## 2019-08-24 MED ORDER — TRANEXAMIC ACID 1000 MG/10ML IV SOLN
2000.0000 mg | INTRAVENOUS | Status: DC
  Filled 2019-08-24: qty 20

## 2019-08-24 MED ORDER — BUPIVACAINE LIPOSOME 1.3 % IJ SUSP
20.0000 mL | Freq: Once | INTRAMUSCULAR | Status: DC
  Filled 2019-08-24: qty 20

## 2019-08-25 ENCOUNTER — Ambulatory Visit (HOSPITAL_COMMUNITY): Payer: Medicare HMO | Admitting: Anesthesiology

## 2019-08-25 ENCOUNTER — Encounter (HOSPITAL_COMMUNITY)
Admission: RE | Disposition: A | Discharge status: Home / Self Care | Payer: Self-pay | Source: Other Acute Inpatient Hospital | Attending: Orthopedic Surgery

## 2019-08-25 ENCOUNTER — Ambulatory Visit (HOSPITAL_COMMUNITY)
Admission: RE | Admit: 2019-08-25 | Discharge: 2019-08-25 | Disposition: A | Discharge status: Home / Self Care | Payer: Medicare HMO | Source: Other Acute Inpatient Hospital | Attending: Orthopedic Surgery | Admitting: Orthopedic Surgery

## 2019-08-25 ENCOUNTER — Ambulatory Visit (HOSPITAL_COMMUNITY): Payer: Medicare HMO | Admitting: Physician Assistant

## 2019-08-25 ENCOUNTER — Other Ambulatory Visit: Payer: Self-pay

## 2019-08-25 ENCOUNTER — Encounter (HOSPITAL_COMMUNITY): Payer: Self-pay | Admitting: Orthopedic Surgery

## 2019-08-25 DIAGNOSIS — Y831 Surgical operation with implant of artificial internal device as the cause of abnormal reaction of the patient, or of later complication, without mention of misadventure at the time of the procedure: Secondary | ICD-10-CM | POA: Insufficient documentation

## 2019-08-25 DIAGNOSIS — I10 Essential (primary) hypertension: Secondary | ICD-10-CM | POA: Diagnosis not present

## 2019-08-25 DIAGNOSIS — Z87891 Personal history of nicotine dependence: Secondary | ICD-10-CM | POA: Insufficient documentation

## 2019-08-25 DIAGNOSIS — Z96652 Presence of left artificial knee joint: Secondary | ICD-10-CM | POA: Diagnosis not present

## 2019-08-25 DIAGNOSIS — K219 Gastro-esophageal reflux disease without esophagitis: Secondary | ICD-10-CM | POA: Diagnosis not present

## 2019-08-25 DIAGNOSIS — Z888 Allergy status to other drugs, medicaments and biological substances status: Secondary | ICD-10-CM | POA: Diagnosis not present

## 2019-08-25 DIAGNOSIS — T84033A Mechanical loosening of internal left knee prosthetic joint, initial encounter: Secondary | ICD-10-CM | POA: Insufficient documentation

## 2019-08-25 DIAGNOSIS — G8918 Other acute postprocedural pain: Secondary | ICD-10-CM | POA: Diagnosis not present

## 2019-08-25 DIAGNOSIS — T8484XA Pain due to internal orthopedic prosthetic devices, implants and grafts, initial encounter: Secondary | ICD-10-CM | POA: Diagnosis present

## 2019-08-25 DIAGNOSIS — M1712 Unilateral primary osteoarthritis, left knee: Secondary | ICD-10-CM | POA: Diagnosis not present

## 2019-08-25 DIAGNOSIS — R69 Illness, unspecified: Secondary | ICD-10-CM | POA: Diagnosis not present

## 2019-08-25 DIAGNOSIS — E669 Obesity, unspecified: Secondary | ICD-10-CM | POA: Insufficient documentation

## 2019-08-25 DIAGNOSIS — Z79899 Other long term (current) drug therapy: Secondary | ICD-10-CM | POA: Diagnosis not present

## 2019-08-25 DIAGNOSIS — Z6830 Body mass index (BMI) 30.0-30.9, adult: Secondary | ICD-10-CM | POA: Insufficient documentation

## 2019-08-25 HISTORY — PX: TOTAL KNEE REVISION: SHX996

## 2019-08-25 SURGERY — TOTAL KNEE REVISION
Anesthesia: General | Site: Knee | Laterality: Left

## 2019-08-25 MED ORDER — DEXAMETHASONE SODIUM PHOSPHATE 10 MG/ML IJ SOLN
INTRAMUSCULAR | Status: DC | PRN
  Administered 2019-08-25: 5 mg via INTRAVENOUS

## 2019-08-25 MED ORDER — DEXAMETHASONE SODIUM PHOSPHATE 10 MG/ML IJ SOLN
INTRAMUSCULAR | Status: AC
  Filled 2019-08-25: qty 1

## 2019-08-25 MED ORDER — TRANEXAMIC ACID-NACL 1000-0.7 MG/100ML-% IV SOLN: 1000.0000 mg | Freq: Once | INTRAVENOUS | Status: DC

## 2019-08-25 MED ORDER — BUPIVACAINE-EPINEPHRINE 0.25% -1:200000 IJ SOLN
INTRAMUSCULAR | Status: DC | PRN
  Administered 2019-08-25: 30 mL

## 2019-08-25 MED ORDER — CHLORHEXIDINE GLUCONATE 0.12 % MT SOLN
15.0000 mL | Freq: Once | OROMUCOSAL | Status: AC
  Administered 2019-08-25: 15 mL via OROMUCOSAL

## 2019-08-25 MED ORDER — SODIUM CHLORIDE 0.9 % IR SOLN
Status: DC | PRN
  Administered 2019-08-25: 3000 mL

## 2019-08-25 MED ORDER — DOXYCYCLINE HYCLATE 50 MG PO CAPS: 100.0000 mg | ORAL_CAPSULE | Freq: Two times a day (BID) | ORAL | 0 refills | Status: AC

## 2019-08-25 MED ORDER — SODIUM CHLORIDE (PF) 0.9 % IJ SOLN
INTRAMUSCULAR | Status: DC | PRN
  Administered 2019-08-25: 50 mL via INTRAVENOUS

## 2019-08-25 MED ORDER — PROPOFOL 10 MG/ML IV BOLUS
INTRAVENOUS | Status: DC | PRN
  Administered 2019-08-25: 20 mg via INTRAVENOUS
  Administered 2019-08-25: 65 ug/kg/min via INTRAVENOUS

## 2019-08-25 MED ORDER — LIDOCAINE 2% (20 MG/ML) 5 ML SYRINGE
INTRAMUSCULAR | Status: AC
  Filled 2019-08-25: qty 5

## 2019-08-25 MED ORDER — BUPIVACAINE-EPINEPHRINE (PF) 0.25% -1:200000 IJ SOLN
INTRAMUSCULAR | Status: AC
  Filled 2019-08-25: qty 30

## 2019-08-25 MED ORDER — SODIUM CHLORIDE (PF) 0.9 % IJ SOLN
INTRAMUSCULAR | Status: AC
  Filled 2019-08-25: qty 50

## 2019-08-25 MED ORDER — PROPOFOL 1000 MG/100ML IV EMUL
INTRAVENOUS | Status: AC
  Filled 2019-08-25: qty 100

## 2019-08-25 MED ORDER — LIDOCAINE 2% (20 MG/ML) 5 ML SYRINGE
INTRAMUSCULAR | Status: DC | PRN
  Administered 2019-08-25: 40 mg via INTRAVENOUS

## 2019-08-25 MED ORDER — TRANEXAMIC ACID-NACL 1000-0.7 MG/100ML-% IV SOLN
1000.0000 mg | INTRAVENOUS | Status: AC
  Administered 2019-08-25: 1000 mg via INTRAVENOUS
  Filled 2019-08-25: qty 100

## 2019-08-25 MED ORDER — FENTANYL CITRATE (PF) 100 MCG/2ML IJ SOLN
INTRAMUSCULAR | Status: DC | PRN
Start: 2019-08-25 — End: 2019-08-25
  Administered 2019-08-25: 50 ug via INTRAVENOUS
  Administered 2019-08-25: 25 ug via INTRAVENOUS

## 2019-08-25 MED ORDER — OXYCODONE HCL 5 MG PO TABS: 5.0000 mg | ORAL_TABLET | Freq: Once | ORAL | Status: AC | PRN

## 2019-08-25 MED ORDER — ONDANSETRON HCL 4 MG/2ML IJ SOLN
INTRAMUSCULAR | Status: DC | PRN
  Administered 2019-08-25: 4 mg via INTRAVENOUS

## 2019-08-25 MED ORDER — ACETAMINOPHEN 10 MG/ML IV SOLN: 1000.0000 mg | Freq: Once | INTRAVENOUS | Status: DC | PRN

## 2019-08-25 MED ORDER — ACETAMINOPHEN 160 MG/5ML PO SOLN: 325.0000 mg | Freq: Once | ORAL | Status: DC | PRN

## 2019-08-25 MED ORDER — POVIDONE-IODINE 10 % EX SWAB
2.0000 "application " | Freq: Once | CUTANEOUS | Status: AC
  Administered 2019-08-25: 2 via TOPICAL

## 2019-08-25 MED ORDER — ORAL CARE MOUTH RINSE: 15.0000 mL | Freq: Once | OROMUCOSAL | Status: AC

## 2019-08-25 MED ORDER — LACTATED RINGERS IV BOLUS
250.0000 mL | Freq: Once | INTRAVENOUS | Status: AC
  Administered 2019-08-25: 250 mL via INTRAVENOUS

## 2019-08-25 MED ORDER — ROPIVACAINE HCL 7.5 MG/ML IJ SOLN
INTRAMUSCULAR | Status: DC | PRN
  Administered 2019-08-25: 20 mL via PERINEURAL

## 2019-08-25 MED ORDER — OXYCODONE HCL 5 MG PO TABS
ORAL_TABLET | ORAL | Status: AC
  Administered 2019-08-25: 5 mg via ORAL
  Filled 2019-08-25: qty 1

## 2019-08-25 MED ORDER — LACTATED RINGERS IV BOLUS
500.0000 mL | Freq: Once | INTRAVENOUS | Status: AC
  Administered 2019-08-25: 500 mL via INTRAVENOUS

## 2019-08-25 MED ORDER — ASPIRIN EC 81 MG PO TBEC: 81.0000 mg | DELAYED_RELEASE_TABLET | Freq: Two times a day (BID) | ORAL | 0 refills | Status: DC

## 2019-08-25 MED ORDER — LACTATED RINGERS IV SOLN: INTRAVENOUS | Status: DC

## 2019-08-25 MED ORDER — MIDAZOLAM HCL 5 MG/5ML IJ SOLN
INTRAMUSCULAR | Status: DC | PRN
  Administered 2019-08-25: 2 mg via INTRAVENOUS

## 2019-08-25 MED ORDER — FENTANYL CITRATE (PF) 100 MCG/2ML IJ SOLN
INTRAMUSCULAR | Status: AC
  Filled 2019-08-25: qty 2

## 2019-08-25 MED ORDER — PHENYLEPHRINE HCL-NACL 10-0.9 MG/250ML-% IV SOLN
INTRAVENOUS | Status: DC | PRN
  Administered 2019-08-25: 25 ug/min via INTRAVENOUS

## 2019-08-25 MED ORDER — BUPIVACAINE IN DEXTROSE 0.75-8.25 % IT SOLN
INTRATHECAL | Status: DC | PRN
  Administered 2019-08-25: 1.8 mL via INTRATHECAL

## 2019-08-25 MED ORDER — TIZANIDINE HCL 2 MG PO TABS: 2.0000 mg | ORAL_TABLET | Freq: Four times a day (QID) | ORAL | 0 refills | Status: DC | PRN

## 2019-08-25 MED ORDER — HYDROMORPHONE HCL 1 MG/ML IJ SOLN: 0.2500 mg | INTRAMUSCULAR | Status: DC | PRN

## 2019-08-25 MED ORDER — TOBRAMYCIN SULFATE 1.2 G IJ SOLR
INTRAMUSCULAR | Status: DC | PRN
  Administered 2019-08-25: 1.2 g

## 2019-08-25 MED ORDER — MIDAZOLAM HCL 2 MG/2ML IJ SOLN
INTRAMUSCULAR | Status: AC
  Filled 2019-08-25: qty 2

## 2019-08-25 MED ORDER — PROPOFOL 10 MG/ML IV BOLUS
INTRAVENOUS | Status: AC
  Filled 2019-08-25: qty 40

## 2019-08-25 MED ORDER — BUPIVACAINE LIPOSOME 1.3 % IJ SUSP
INTRAMUSCULAR | Status: DC | PRN
  Administered 2019-08-25: 20 mL

## 2019-08-25 MED ORDER — GABAPENTIN 100 MG PO CAPS: 100.0000 mg | ORAL_CAPSULE | Freq: Three times a day (TID) | ORAL | Status: DC

## 2019-08-25 MED ORDER — AMISULPRIDE (ANTIEMETIC) 5 MG/2ML IV SOLN: 10.0000 mg | Freq: Once | INTRAVENOUS | Status: DC | PRN

## 2019-08-25 MED ORDER — MEPERIDINE HCL 50 MG/ML IJ SOLN: 6.2500 mg | INTRAMUSCULAR | Status: DC | PRN

## 2019-08-25 MED ORDER — ACETAMINOPHEN 325 MG PO TABS: 325.0000 mg | ORAL_TABLET | Freq: Once | ORAL | Status: DC | PRN

## 2019-08-25 MED ORDER — CEFAZOLIN SODIUM-DEXTROSE 2-4 GM/100ML-% IV SOLN
2.0000 g | INTRAVENOUS | Status: AC
  Administered 2019-08-25: 2 g via INTRAVENOUS
  Filled 2019-08-25: qty 100

## 2019-08-25 MED ORDER — OXYCODONE HCL 5 MG PO TABS: 5.0000 mg | ORAL_TABLET | ORAL | Status: DC | PRN

## 2019-08-25 MED ORDER — ONDANSETRON HCL 4 MG/2ML IJ SOLN
INTRAMUSCULAR | Status: AC
  Filled 2019-08-25: qty 2

## 2019-08-25 MED ORDER — CEFTRIAXONE SODIUM 1 G IJ SOLR
1.0000 g | INTRAMUSCULAR | Status: AC
  Administered 2019-08-25: 1 g via INTRAMUSCULAR
  Filled 2019-08-25: qty 10

## 2019-08-25 MED ORDER — OXYCODONE HCL 5 MG/5ML PO SOLN: 5.0000 mg | Freq: Once | ORAL | Status: AC | PRN

## 2019-08-25 MED ORDER — TOBRAMYCIN SULFATE 1.2 G IJ SOLR
INTRAMUSCULAR | Status: AC
  Filled 2019-08-25: qty 1.2

## 2019-08-25 MED ORDER — OXYCODONE-ACETAMINOPHEN 5-325 MG PO TABS: 1.0000 | ORAL_TABLET | ORAL | 0 refills | Status: DC | PRN

## 2019-08-25 SURGICAL SUPPLY — 64 items
ATTUNE MED DOME PAT 32 KNEE (Knees) ×1 IMPLANT
ATTUNE PSRP INSR SZ 5 10M KNEE (Insert) ×1 IMPLANT
BAG DECANTER FOR FLEXI CONT (MISCELLANEOUS) ×2 IMPLANT
BAG SPEC THK2 15X12 ZIP CLS (MISCELLANEOUS) ×1
BAG ZIPLOCK 12X15 (MISCELLANEOUS) ×2 IMPLANT
BLADE OSCILLATING/SAGITTAL (BLADE) ×4
BLADE SAG 18X100X1.27 (BLADE) ×2 IMPLANT
BLADE SAW SGTL 11.0X1.19X90.0M (BLADE) ×2 IMPLANT
BLADE SAW SGTL 81X20 HD (BLADE) ×2 IMPLANT
BLADE SURG SZ10 CARB STEEL (BLADE) ×4 IMPLANT
BLADE SW THK.38XMED LNG THN (BLADE) ×2 IMPLANT
BNDG CMPR MED 10X6 ELC LF (GAUZE/BANDAGES/DRESSINGS) ×1
BNDG ELASTIC 6X10 VLCR STRL LF (GAUZE/BANDAGES/DRESSINGS) ×3 IMPLANT
BOWL SMART MIX CTS (DISPOSABLE) ×2 IMPLANT
BRUSH FEMORAL CANAL (MISCELLANEOUS) ×2 IMPLANT
BSPLAT TIB 4 CMNT REV ROT PLAT (Knees) ×1 IMPLANT
BUR OVAL CARBIDE 4.0 (BURR) IMPLANT
CEMENT HV SMART SET (Cement) ×4 IMPLANT
COVER SURGICAL LIGHT HANDLE (MISCELLANEOUS) ×2 IMPLANT
COVER WAND RF STERILE (DRAPES) IMPLANT
CUFF TOURN SGL QUICK 34 (TOURNIQUET CUFF) ×2
CUFF TRNQT CYL 34X4.125X (TOURNIQUET CUFF) ×1 IMPLANT
DECANTER SPIKE VIAL GLASS SM (MISCELLANEOUS) ×6 IMPLANT
DRAPE ORTHO SPLIT 77X108 STRL (DRAPES) ×4
DRAPE SURG ORHT 6 SPLT 77X108 (DRAPES) ×2 IMPLANT
DRAPE U-SHAPE 47X51 STRL (DRAPES) ×2 IMPLANT
DRESSING AQUACEL AG SP 3.5X10 (GAUZE/BANDAGES/DRESSINGS) IMPLANT
DRSG AQUACEL AG ADV 3.5X10 (GAUZE/BANDAGES/DRESSINGS) ×1 IMPLANT
DRSG AQUACEL AG ADV 3.5X14 (GAUZE/BANDAGES/DRESSINGS) IMPLANT
DRSG AQUACEL AG SP 3.5X10 (GAUZE/BANDAGES/DRESSINGS)
DURAPREP 26ML APPLICATOR (WOUND CARE) ×2 IMPLANT
ELECT REM PT RETURN 15FT ADLT (MISCELLANEOUS) ×2 IMPLANT
GLOVE BIO SURGEON STRL SZ7.5 (GLOVE) ×2 IMPLANT
GLOVE BIO SURGEON STRL SZ8.5 (GLOVE) ×2 IMPLANT
GLOVE BIOGEL PI IND STRL 8 (GLOVE) ×1 IMPLANT
GLOVE BIOGEL PI IND STRL 9 (GLOVE) ×1 IMPLANT
GLOVE BIOGEL PI INDICATOR 8 (GLOVE) ×1
GLOVE BIOGEL PI INDICATOR 9 (GLOVE) ×1
GOWN STRL REUS W/TWL XL LVL3 (GOWN DISPOSABLE) ×4 IMPLANT
HANDPIECE INTERPULSE COAX TIP (DISPOSABLE) ×2
HOOD PEEL AWAY FLYTE STAYCOOL (MISCELLANEOUS) ×6 IMPLANT
INSERT TIB CMT ATTUNE RP SZ4 (Knees) ×1 IMPLANT
KIT TURNOVER KIT A (KITS) IMPLANT
NDL HYPO 21X1.5 SAFETY (NEEDLE) ×2 IMPLANT
NEEDLE HYPO 21X1.5 SAFETY (NEEDLE) ×4 IMPLANT
NS IRRIG 1000ML POUR BTL (IV SOLUTION) ×2 IMPLANT
PACK TOTAL KNEE CUSTOM (KITS) ×2 IMPLANT
PENCIL SMOKE EVACUATOR (MISCELLANEOUS) IMPLANT
PIN STEINMAN FIXATION KNEE (PIN) ×1 IMPLANT
PROTECTOR NERVE ULNAR (MISCELLANEOUS) ×2 IMPLANT
SET HNDPC FAN SPRY TIP SCT (DISPOSABLE) ×1 IMPLANT
STAPLER VISISTAT 35W (STAPLE) IMPLANT
STEM STR ATTUNE PF 14X110 (Knees) ×1 IMPLANT
SUT VIC AB 1 CTX 36 (SUTURE) ×2
SUT VIC AB 1 CTX36XBRD ANBCTR (SUTURE) ×1 IMPLANT
SUT VIC AB 3-0 CT1 27 (SUTURE) ×4
SUT VIC AB 3-0 CT1 TAPERPNT 27 (SUTURE) ×1 IMPLANT
SWAB COLLECTION DEVICE MRSA (MISCELLANEOUS) IMPLANT
SWAB CULTURE ESWAB REG 1ML (MISCELLANEOUS) IMPLANT
SYR CONTROL 10ML LL (SYRINGE) ×4 IMPLANT
TRAY FOLEY MTR SLVR 16FR STAT (SET/KITS/TRAYS/PACK) ×2 IMPLANT
WATER STERILE IRR 1000ML POUR (IV SOLUTION) ×4 IMPLANT
WRAP KNEE MAXI GEL POST OP (GAUZE/BANDAGES/DRESSINGS) ×2 IMPLANT
YANKAUER SUCT BULB TIP 10FT TU (MISCELLANEOUS) ×2 IMPLANT

## 2019-08-25 NOTE — Anesthesia Preprocedure Evaluation (Addendum)
Anesthesia Evaluation  Patient identified by MRN, date of birth, ID band Patient awake    Reviewed: Allergy & Precautions, NPO status , Patient's Chart, lab work & pertinent test results  Airway Mallampati: II  TM Distance: >3 FB Neck ROM: Full    Dental  (+) Teeth Intact, Dental Advisory Given   Pulmonary former smoker,    breath sounds clear to auscultation       Cardiovascular hypertension,  Rhythm:Regular Rate:Normal     Neuro/Psych PSYCHIATRIC DISORDERS Depression negative neurological ROS     GI/Hepatic Neg liver ROS, GERD  Medicated,  Endo/Other  negative endocrine ROS  Renal/GU negative Renal ROS     Musculoskeletal  (+) Arthritis , Osteoarthritis,    Abdominal Normal abdominal exam  (+)   Peds  Hematology negative hematology ROS (+)   Anesthesia Other Findings   Reproductive/Obstetrics                            Anesthesia Physical Anesthesia Plan  ASA: II  Anesthesia Plan: General   Post-op Pain Management:    Induction: Intravenous  PONV Risk Score and Plan: 3 and Ondansetron, Propofol infusion and Midazolam  Airway Management Planned: Natural Airway and Simple Face Mask  Additional Equipment: None  Intra-op Plan:   Post-operative Plan:   Informed Consent: I have reviewed the patients History and Physical, chart, labs and discussed the procedure including the risks, benefits and alternatives for the proposed anesthesia with the patient or authorized representative who has indicated his/her understanding and acceptance.       Plan Discussed with: CRNA  Anesthesia Plan Comments: (Lab Results      Component                Value               Date                      WBC                      6.2                 08/14/2019                HGB                      13.0                08/14/2019                HCT                      39.9                08/14/2019                 MCV                      94.8                08/14/2019                PLT                      253  08/14/2019           )       Anesthesia Quick Evaluation

## 2019-08-25 NOTE — Transfer of Care (Signed)
Immediate Anesthesia Transfer of Care Note  Patient: Laura Bean  Procedure(s) Performed: Procedure(s): LEFT TOTAL KNEE REVISION (Left)  Patient Location: PACU  Anesthesia Type:Spinal  Level of Consciousness:  sedated, patient cooperative and responds to stimulation  Airway & Oxygen Therapy:Patient Spontanous Breathing and Patient connected to face mask oxgen  Post-op Assessment:  Report given to PACU RN and Post -op Vital signs reviewed and stable  Post vital signs:  Reviewed and stable  Last Vitals:  Vitals:   08/25/19 0634  BP: 114/74  Pulse: 79  Resp: 16  Temp: 36.8 C  SpO2: 52%    Complications: No apparent anesthesia complications

## 2019-08-25 NOTE — Op Note (Signed)
PATIENT ID:      Laura Bean  MRN:     [id number] DOB/AGE:    [birth year] / 69 y.o.       OPERATIVE REPORT   DATE OF PROCEDURE:  08/25/2019      PREOPERATIVE DIAGNOSIS:   LOOSE LEFT KNEE REPLACEMENT      Estimated body mass index is 30.55 kg/m as calculated from the following:   Height as of this encounter: 5\' 4"  (1.626 m).   Weight as of this encounter: 80.7 kg.                                                       POSTOPERATIVE DIAGNOSIS:   Same                                                                  PROCEDURE:  Procedure(s): LEFT TOTAL KNEE REVISION removal of loose tibial implant and patella and revision to a new Depuy attune 4 tibial baseplate with 366 mm x 14 mm stem and a new 32 mm patellar button using a double batch of DePuy HV cement with 1 vial of Zinacef and 1 vial of tobramycin in the cement.    SURGEON: Kerin Salen  ASSISTANT:   Kerry Hough. [employer]   (Present and scrubbed throughout the case, critical for assistance with exposure, retraction, instrumentation, and closure.)        ANESTHESIA: Spinal, 20cc Exparel, 50cc 0.25% Marcaine EBL: 300 cc FLUID REPLACEMENT: 1800 cc crystaloid TOURNIQUET: DRAINS: None TRANEXAMIC ACID: 1gm IV, 2gm topical COMPLICATIONS:  None      Specimens: Synovial fluid and synovial tissue for Gram stain and culture.    INDICATIONS FOR PROCEDURE: The patient has  LOOSE LEFT KNEE REPLACEMENT Depuy attune placed 5 years ago.  Increasing proximal tibial pain.  X-rays were nondiagnostic, patient had a bone scan accomplished showing increased uptake along the tibia and patellar implants.  Inflammatory markers were negative.  Aspiration of the knee also negative.  Patient desires elective exploration of her left total knee with revision of any loose implants.  Risks and benefits of surgery have been discussed, questions answered.   DESCRIPTION OF PROCEDURE: The patient identified by armband, received  IV antibiotics, in the holding area at  [facility]. Patient taken to the operating room, appropriate anesthetic monitors were attached, and spinal anesthesia was  induced. IV Tranexamic acid was given.Tourniquet applied high to the operative thigh. Lateral post and foot positioner applied to the table, the lower extremity was then prepped and draped in usual sterile fashion from the toes to the tourniquet. Time-out procedure was performed. Kerry Hough. [facility] PAC, was present and scrubbed throughout the case, critical for assistance with, positioning, exposure, retraction, instrumentation, and closure.The skin and subcutaneous tissue along the incision was injected with 20 cc of a mixture of Exparel and Marcaine solution, using a 20-gauge by 1-1/2 inch needle. We began the operation, with the knee flexed 130 degrees, by making the anterior midline incision starting at handbreadth above the patella going over the patella 1 cm medial to and 4 cm  distal to the tibial tubercle. Small bleeders in the skin and the subcutaneous tissue identified and cauterized. Transverse retinaculum was incised and reflected medially and a medial parapatellar arthrotomy was accomplished.  Synovial fluid and soft tissue was sent for Gram stain and culture.  The superficial medial collateral ligament was peeled off the proximal medial flare of the tibia exposing the bearing and tibial implants there was no gross loosening noted.  We resected scar tissue from around the patella and were able to evert the patella hyperflexed the knee to 130 degrees and translate the tibia anterior to the femur.  This allowed removal of the polyethylene bearing which was in good shape.  A PCL retractor was then placed to further translate anteriorly medial and lateral Hohmann retractors we then tested the tibia with 1/2 inch wide osteotome and it was loose it came right off of the cement mantle.  Interestingly the cement mantle itself was loose inside the bone and came out as a single piece.   We sent then set about removing fibrous membrane from the bone.  We then reamed the tibia up to 14 mm for the appropriate depth of 110 mm stem.  Using the 14 mm reamer we then performed a 1 to 2 mm proximal tibial cut and did fenestrate some of the sclerotic bone with a guidepin.  We then assembled a trial with a 4 tibial baseplate and 427 mm x 14 mm stem inserted into the tibia and performed the delta fin keel punch.  At this point we tested the femur to make sure was not loose we then performed trial reductions with 6 8 and 10 bearings and a 10 mm bearing had the best stability and still came to full extension.  We then evaluated the patella which had some motion with testing we removed it with a small ACL sawblade performed a dust cut on the patellar bone also fenestrated sclerotic bone with a guidepin.  We then sized for a 32 mm patella and drilled the patella.  At this point all surfaces were WaterPik cleaned and dried with suction and sponges.  A double batch of DePuy HV cement with 1 vial of Zinacef and 1 vial of tobramycin was mixed at the back table and applied to the tibial baseplate but not the stem in the proximal tibia and the tibial implant was hammered into place.  The 10 mm #4 bearing was inserted and the knee extended to 30 degrees with compression.  The 32 mm patellar button was also coated with cement as with the patella and this was held in place with the patellar clamp.  Once again all surfaces were WaterPik clean Exparel was injected into the soft tissues. The knee was held at 30 flexion with compression, while the cement cured. The wound was irrigated out with normal saline solution pulse lavage. The rest of the Exparel was injected into the parapatellar arthrotomy, subcutaneous tissues, and periosteal tissues. The parapatellar arthrotomy was closed with running #1 Vicryl suture. The subcutaneous tissue with 3-0 undyed Vicryl suture, and the skin with running 3-0 SQ vicryl. An Aquacil and Ace  wrap were applied. The patient was taken to recovery room without difficulty.   Kerin Salen 08/25/2019, 69:25 AM

## 2019-08-25 NOTE — Anesthesia Procedure Notes (Signed)
Anesthesia Regional Block: Adductor canal block   Pre-Anesthetic Checklist: ,, timeout performed, Correct Patient, Correct Site, Correct Laterality, Correct Procedure, Correct Position, site marked, Risks and benefits discussed,  Surgical consent,  Pre-op evaluation,  At surgeon's request and post-op pain management  Laterality: Left  Prep: chloraprep       Needles:  Injection technique: Single-shot  Needle Type: Echogenic Stimulator Needle     Needle Length: 9cm  Needle Gauge: 21     Additional Needles:   Procedures:,,,, ultrasound used (permanent image in chart),,,,  Narrative:  Start time: 08/25/2019 7:05 AM End time: 08/25/2019 7:10 AM Injection made incrementally with aspirations every 5 mL.  Performed by: Personally  Anesthesiologist: Effie Berkshire, MD  Additional Notes: Patient tolerated the procedure well. Local anesthetic introduced in an incremental fashion under minimal resistance after negative aspirations. No paresthesias were elicited. After completion of the procedure, no acute issues were identified and patient continued to be monitored by RN.

## 2019-08-25 NOTE — Evaluation (Signed)
Physical Therapy Evaluation Patient Details Name: Laura Bean MRN: [id number] DOB: 10-Dec-1950 Today's Date: 08/25/2019   History of Present Illness  Patient is a 69 y.o. female s/p Lt TKR for tibial and patella components on 08/25/19 with PMH significant for HTN, GERD, depression, OA, original LT TKA In 2018, gastric bypass in 2002.     Clinical Impression  Laura Bean is a 69 y.o. female POD 0 s/p Lt TKA. Patient reports independence with mobility at baseline. Patient is now limited by functional impairments (see PT problem list below) and requires min guard/supervision for transfers and gait with RW. Patient was able to ambulate ~110 feet with RW and min guard/supervision and cues for safe walker management. Patient educated on safe sequencing for stair mobility and verbalized safe guarding position for people assisting with mobility. Patient instructed in exercises to facilitate ROM and circulation. Patient will benefit from continued skilled PT interventions to address impairments and progress towards PLOF. Patient has met mobility goals at adequate level for discharge home; will continue to follow if pt continues acute stay to progress towards Mod I goals.     Follow Up Recommendations Follow surgeon's recommendation for DC plan and follow-up therapies;Home health PT    Equipment Recommendations  None recommended by PT    Recommendations for Other Services       Precautions / Restrictions Precautions Precautions: Fall Restrictions Weight Bearing Restrictions: No Other Position/Activity Restrictions: WBAT      Mobility  Bed Mobility Overal bed mobility: Needs Assistance Bed Mobility: Supine to Sit     Supine to sit: Supervision     General bed mobility comments: no assist required to sit up to EOB, pt required some extra time.   Transfers Overall transfer level: Needs assistance Equipment used: Rolling walker (2 wheeled) Transfers: Sit to/from Stand Sit to Stand: Min  guard;Supervision         General transfer comment: cues for technique with RW and no overt LOB  Ambulation/Gait Ambulation/Gait assistance: Min guard;Supervision Gait Distance (Feet): 110 Feet Assistive device: Rolling walker (2 wheeled) Gait Pattern/deviations: Step-to pattern;Decreased stride length Gait velocity: decr   General Gait Details: VC's for safe step pattern and proximity to RW. No overt LOB noted and no buckling at Lt knee.  Stairs Stairs: Yes Stairs assistance: Min guard Stair Management: One rail Left;Sideways;Step to pattern Number of Stairs: 4 (2x2) General stair comments: Vc's for step sequencing "up with good, down with bad" no overt LOB noted. pt verablized safe guarding for family to provide.  Wheelchair Mobility    Modified Rankin (Stroke Patients Only)       Balance Overall balance assessment: Needs assistance Sitting-balance support: Feet supported Sitting balance-Leahy Scale: Good     Standing balance support: During functional activity;Bilateral upper extremity supported Standing balance-Leahy Scale: Fair              Pertinent Vitals/Pain Pain Assessment: 0-10 Pain Score: 1  Pain Location: Lt knee Pain Descriptors / Indicators: Discomfort Pain Intervention(s): Limited activity within patient's tolerance;Monitored during session;Repositioned    Home Living Family/patient expects to be discharged to:: Private residence Living Arrangements: Other relatives Available Help at Discharge: Family Type of Home: House Home Access: Stairs to enter Entrance Stairs-Rails: Right;Left (at front) Technical brewer of Steps: 4 Home Layout: One level Home Equipment: Grab bars - tub/shower;Shower seat;Walker - 2 wheels;Cane - single point;Bedside commode Additional Comments: sister-in-law staying with her for 2 weeks.     Prior Function Level of Independence: Independent  Hand Dominance   Dominant Hand: Right     Extremity/Trunk Assessment   Upper Extremity Assessment Upper Extremity Assessment: Overall WFL for tasks assessed    Lower Extremity Assessment Lower Extremity Assessment: LLE deficits/detail LLE Deficits / Details: good quad activaiton, no extensor lag  LLE Sensation: WNL LLE Coordination: WNL    Cervical / Trunk Assessment Cervical / Trunk Assessment: Normal  Communication   Communication: No difficulties  Cognition Arousal/Alertness: Awake/alert Behavior During Therapy: WFL for tasks assessed/performed Overall Cognitive Status: Within Functional Limits for tasks assessed               General Comments      Exercises Total Joint Exercises Ankle Circles/Pumps: AROM;Both;10 reps;Seated Quad Sets: AROM;Left;Seated;Other reps (comment) (3) Short Arc Quad: AROM;Left;Other reps (comment);Seated (3) Heel Slides: AROM;Left;Other reps (comment);Seated (3) Hip ABduction/ADduction: AROM;Left;Other reps (comment);Seated (2) Straight Leg Raises: AROM;Left;Seated;Other reps (comment) (1) Long Arc Quad: AROM;Left;Other reps (comment);Seated (3) Knee Flexion: AROM;AAROM;Left;Other reps (comment);Seated (2)   Assessment/Plan    PT Assessment Patient needs continued PT services  PT Problem List Decreased strength;Decreased range of motion;Decreased activity tolerance;Decreased balance;Decreased mobility;Decreased coordination;Decreased knowledge of use of DME;Pain       PT Treatment Interventions DME instruction;Gait training;Stair training;Functional mobility training;Therapeutic activities;Therapeutic exercise;Balance training;Patient/family education    PT Goals (Current goals can be found in the Care Plan section)  Acute Rehab PT Goals Patient Stated Goal: 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 and back to walking dogs PT Goal Formulation: With patient Time For Goal Achievement: 09/01/19 Potential to Achieve Goals: Good    Frequency 7X/week   Barriers to discharge           AM-PAC PT "6  Clicks" Mobility  Outcome Measure Help needed turning from your back to your side while in a flat bed without using bedrails?: None Help needed moving from lying on your back to sitting on the side of a flat bed without using bedrails?: None Help needed moving to and from a bed to a chair (including a wheelchair)?: A Little Help needed standing up from a chair using your arms (e.g., wheelchair or bedside chair)?: A Little Help needed to walk in hospital room?: A Little Help needed climbing 3-5 steps with a railing? : A Little 6 Click Score: 20    End of Session Equipment Utilized During Treatment: Gait belt Activity Tolerance: Patient tolerated treatment well Patient left: in chair;with call bell/phone within reach Nurse Communication: Mobility status (has not voided bladder) PT Visit Diagnosis: Muscle weakness (generalized) (M62.81);Difficulty in walking, not elsewhere classified (R26.2)    Time: 5465-0354 PT Time Calculation (min) (ACUTE ONLY): 41 min   Charges:   PT Evaluation $PT Eval Low Complexity: 1 Low PT Treatments $Gait Training: 8-22 mins $Therapeutic Exercise: 8-22 mins        Verner Mould, DPT Acute Rehabilitation Services  Office [phone] Pager [phone]  08/25/2019 2:14 PM

## 2019-08-25 NOTE — Anesthesia Procedure Notes (Signed)
Spinal  Start time: 08/25/2019 7:30 AM End time: 08/25/2019 7:32 AM Staffing Performed: anesthesiologist  Anesthesiologist: Effie Berkshire, MD Preanesthetic Checklist Completed: patient identified, IV checked, site marked, risks and benefits discussed, surgical consent, monitors and equipment checked, pre-op evaluation and timeout performed Spinal Block Patient position: sitting Prep: DuraPrep and site prepped and draped Location: L3-4 Injection technique: single-shot Needle Needle type: Pencan  Needle gauge: 24 G Needle length: 10 cm Needle insertion depth: 10 cm Additional Notes Patient tolerated well. No immediate complications.

## 2019-08-25 NOTE — Interval H&P Note (Signed)
History and Physical Interval Note:  08/25/2019 7:09 AM  Laura Bean  has presented today for surgery, with the diagnosis of LOOSE LEFT KNEE REPLACEMENT.  The various methods of treatment have been discussed with the patient and family. After consideration of risks, benefits and other options for treatment, the patient has consented to  Procedure(s): LEFT TOTAL KNEE REVISION (Left) as a surgical intervention.  The patient's history has been reviewed, patient examined, no change in status, stable for surgery.  I have reviewed the patient's chart and labs.  Questions were answered to the patient's satisfaction.     Kerin Salen

## 2019-08-25 NOTE — Progress Notes (Signed)
Orthopedic Tech Progress Note Patient Details:  Laura Bean 12/18/1950 [id number]  Ortho Devices Type of Ortho Device: Bone foam zero knee Ortho Device/Splint Location: LLE Ortho Device/Splint Interventions: Ordered, Application   Post Interventions Patient Tolerated: Well Instructions Provided: Care of device   Braulio Bosch 08/25/2019, 11:44 AM

## 2019-08-25 NOTE — Discharge Instructions (Signed)

## 2019-08-26 ENCOUNTER — Encounter (HOSPITAL_COMMUNITY): Payer: Self-pay | Admitting: Orthopedic Surgery

## 2019-08-26 NOTE — Anesthesia Postprocedure Evaluation (Signed)
Anesthesia Post Note  Patient: Russie Gulledge  Procedure(s) Performed: LEFT TOTAL KNEE REVISION (Left Knee)     Patient location during evaluation: PACU Anesthesia Type: Spinal Level of consciousness: oriented and awake and alert Pain management: pain level controlled Vital Signs Assessment: post-procedure vital signs reviewed and stable Respiratory status: spontaneous breathing and respiratory function stable Cardiovascular status: blood pressure returned to baseline and stable Postop Assessment: no headache, no backache, patient able to bend at knees, spinal receding and no apparent nausea or vomiting Anesthetic complications: no   No complications documented.  Last Vitals:  Vitals:   08/25/19 1400 08/25/19 1515  BP: 132/68 136/84  Pulse: 61 (!) 58  Resp: 16 16  Temp:  36.7 C  SpO2: 98% 100%    Last Pain:  Vitals:   08/25/19 1515  TempSrc:   PainSc: 1    Pain Goal: Patients Stated Pain Goal: 2 (08/25/19 5456)                 Pervis Hocking

## 2019-08-29 DIAGNOSIS — Z96652 Presence of left artificial knee joint: Secondary | ICD-10-CM | POA: Diagnosis not present

## 2019-08-29 DIAGNOSIS — M1712 Unilateral primary osteoarthritis, left knee: Secondary | ICD-10-CM | POA: Diagnosis not present

## 2019-08-29 DIAGNOSIS — M25662 Stiffness of left knee, not elsewhere classified: Secondary | ICD-10-CM | POA: Diagnosis not present

## 2019-08-30 LAB — AEROBIC/ANAEROBIC CULTURE W GRAM STAIN (SURGICAL/DEEP WOUND)
Culture: NO GROWTH
Culture: NO GROWTH
Culture: NO GROWTH
Gram Stain: NONE SEEN
Gram Stain: NONE SEEN

## 2019-09-02 DIAGNOSIS — M25662 Stiffness of left knee, not elsewhere classified: Secondary | ICD-10-CM | POA: Diagnosis not present

## 2019-09-02 DIAGNOSIS — M1712 Unilateral primary osteoarthritis, left knee: Secondary | ICD-10-CM | POA: Diagnosis not present

## 2019-09-02 DIAGNOSIS — Z96652 Presence of left artificial knee joint: Secondary | ICD-10-CM | POA: Diagnosis not present

## 2019-09-04 DIAGNOSIS — M25662 Stiffness of left knee, not elsewhere classified: Secondary | ICD-10-CM | POA: Diagnosis not present

## 2019-09-04 DIAGNOSIS — Z471 Aftercare following joint replacement surgery: Secondary | ICD-10-CM | POA: Diagnosis not present

## 2019-09-04 DIAGNOSIS — Z96652 Presence of left artificial knee joint: Secondary | ICD-10-CM | POA: Diagnosis not present

## 2019-09-04 DIAGNOSIS — M1712 Unilateral primary osteoarthritis, left knee: Secondary | ICD-10-CM | POA: Diagnosis not present

## 2019-09-09 DIAGNOSIS — M25662 Stiffness of left knee, not elsewhere classified: Secondary | ICD-10-CM | POA: Diagnosis not present

## 2019-09-09 DIAGNOSIS — M1712 Unilateral primary osteoarthritis, left knee: Secondary | ICD-10-CM | POA: Diagnosis not present

## 2019-09-09 DIAGNOSIS — Z96652 Presence of left artificial knee joint: Secondary | ICD-10-CM | POA: Diagnosis not present

## 2019-09-11 DIAGNOSIS — Z96652 Presence of left artificial knee joint: Secondary | ICD-10-CM | POA: Diagnosis not present

## 2019-09-11 DIAGNOSIS — M25662 Stiffness of left knee, not elsewhere classified: Secondary | ICD-10-CM | POA: Diagnosis not present

## 2019-09-11 DIAGNOSIS — M1712 Unilateral primary osteoarthritis, left knee: Secondary | ICD-10-CM | POA: Diagnosis not present

## 2019-09-15 DIAGNOSIS — Z96652 Presence of left artificial knee joint: Secondary | ICD-10-CM | POA: Diagnosis not present

## 2019-09-15 DIAGNOSIS — M25662 Stiffness of left knee, not elsewhere classified: Secondary | ICD-10-CM | POA: Diagnosis not present

## 2019-09-15 DIAGNOSIS — M1712 Unilateral primary osteoarthritis, left knee: Secondary | ICD-10-CM | POA: Diagnosis not present

## 2019-09-16 DIAGNOSIS — R69 Illness, unspecified: Secondary | ICD-10-CM | POA: Diagnosis not present

## 2019-09-17 DIAGNOSIS — Z96652 Presence of left artificial knee joint: Secondary | ICD-10-CM | POA: Diagnosis not present

## 2019-09-17 DIAGNOSIS — M25662 Stiffness of left knee, not elsewhere classified: Secondary | ICD-10-CM | POA: Diagnosis not present

## 2019-09-17 DIAGNOSIS — M1712 Unilateral primary osteoarthritis, left knee: Secondary | ICD-10-CM | POA: Diagnosis not present

## 2019-09-19 DIAGNOSIS — I10 Essential (primary) hypertension: Secondary | ICD-10-CM | POA: Diagnosis not present

## 2019-09-19 DIAGNOSIS — K219 Gastro-esophageal reflux disease without esophagitis: Secondary | ICD-10-CM | POA: Diagnosis not present

## 2019-09-19 DIAGNOSIS — M8580 Other specified disorders of bone density and structure, unspecified site: Secondary | ICD-10-CM | POA: Diagnosis not present

## 2019-09-19 DIAGNOSIS — G479 Sleep disorder, unspecified: Secondary | ICD-10-CM | POA: Diagnosis not present

## 2019-09-19 DIAGNOSIS — K912 Postsurgical malabsorption, not elsewhere classified: Secondary | ICD-10-CM | POA: Diagnosis not present

## 2019-09-19 DIAGNOSIS — Z1389 Encounter for screening for other disorder: Secondary | ICD-10-CM | POA: Diagnosis not present

## 2019-09-19 DIAGNOSIS — Z0000 Encounter for general adult medical examination without abnormal findings: Secondary | ICD-10-CM | POA: Diagnosis not present

## 2019-09-19 DIAGNOSIS — E538 Deficiency of other specified B group vitamins: Secondary | ICD-10-CM | POA: Diagnosis not present

## 2019-09-19 DIAGNOSIS — M899 Disorder of bone, unspecified: Secondary | ICD-10-CM | POA: Diagnosis not present

## 2019-09-19 DIAGNOSIS — Z23 Encounter for immunization: Secondary | ICD-10-CM | POA: Diagnosis not present

## 2019-09-22 DIAGNOSIS — M1712 Unilateral primary osteoarthritis, left knee: Secondary | ICD-10-CM | POA: Diagnosis not present

## 2019-09-22 DIAGNOSIS — Z96652 Presence of left artificial knee joint: Secondary | ICD-10-CM | POA: Diagnosis not present

## 2019-09-22 DIAGNOSIS — M25662 Stiffness of left knee, not elsewhere classified: Secondary | ICD-10-CM | POA: Diagnosis not present

## 2019-09-30 DIAGNOSIS — Z96652 Presence of left artificial knee joint: Secondary | ICD-10-CM | POA: Diagnosis not present

## 2019-09-30 DIAGNOSIS — M25662 Stiffness of left knee, not elsewhere classified: Secondary | ICD-10-CM | POA: Diagnosis not present

## 2019-09-30 DIAGNOSIS — M1712 Unilateral primary osteoarthritis, left knee: Secondary | ICD-10-CM | POA: Diagnosis not present

## 2019-11-06 DIAGNOSIS — Z9181 History of falling: Secondary | ICD-10-CM | POA: Diagnosis not present

## 2019-11-06 DIAGNOSIS — G4700 Insomnia, unspecified: Secondary | ICD-10-CM | POA: Diagnosis not present

## 2019-11-06 DIAGNOSIS — Z8249 Family history of ischemic heart disease and other diseases of the circulatory system: Secondary | ICD-10-CM | POA: Diagnosis not present

## 2019-11-06 DIAGNOSIS — R32 Unspecified urinary incontinence: Secondary | ICD-10-CM | POA: Diagnosis not present

## 2019-11-06 DIAGNOSIS — Z87891 Personal history of nicotine dependence: Secondary | ICD-10-CM | POA: Diagnosis not present

## 2019-11-06 DIAGNOSIS — I10 Essential (primary) hypertension: Secondary | ICD-10-CM | POA: Diagnosis not present

## 2019-11-06 DIAGNOSIS — K219 Gastro-esophageal reflux disease without esophagitis: Secondary | ICD-10-CM | POA: Diagnosis not present

## 2019-11-06 DIAGNOSIS — Z008 Encounter for other general examination: Secondary | ICD-10-CM | POA: Diagnosis not present

## 2019-11-06 DIAGNOSIS — L309 Dermatitis, unspecified: Secondary | ICD-10-CM | POA: Diagnosis not present

## 2019-11-06 DIAGNOSIS — G8929 Other chronic pain: Secondary | ICD-10-CM | POA: Diagnosis not present

## 2019-11-06 DIAGNOSIS — Z803 Family history of malignant neoplasm of breast: Secondary | ICD-10-CM | POA: Diagnosis not present

## 2020-01-08 DIAGNOSIS — Z20822 Contact with and (suspected) exposure to covid-19: Secondary | ICD-10-CM | POA: Diagnosis not present

## 2020-01-09 ENCOUNTER — Other Ambulatory Visit: Payer: Medicare HMO

## 2020-02-03 DIAGNOSIS — Z96652 Presence of left artificial knee joint: Secondary | ICD-10-CM | POA: Diagnosis not present

## 2020-02-03 DIAGNOSIS — Z471 Aftercare following joint replacement surgery: Secondary | ICD-10-CM | POA: Diagnosis not present

## 2020-02-05 DIAGNOSIS — H9202 Otalgia, left ear: Secondary | ICD-10-CM | POA: Diagnosis not present

## 2020-03-18 DIAGNOSIS — K219 Gastro-esophageal reflux disease without esophagitis: Secondary | ICD-10-CM | POA: Diagnosis not present

## 2020-03-18 DIAGNOSIS — G479 Sleep disorder, unspecified: Secondary | ICD-10-CM | POA: Diagnosis not present

## 2020-03-18 DIAGNOSIS — E538 Deficiency of other specified B group vitamins: Secondary | ICD-10-CM | POA: Diagnosis not present

## 2020-03-18 DIAGNOSIS — I10 Essential (primary) hypertension: Secondary | ICD-10-CM | POA: Diagnosis not present

## 2020-03-18 DIAGNOSIS — M8580 Other specified disorders of bone density and structure, unspecified site: Secondary | ICD-10-CM | POA: Diagnosis not present

## 2020-03-18 DIAGNOSIS — K912 Postsurgical malabsorption, not elsewhere classified: Secondary | ICD-10-CM | POA: Diagnosis not present

## 2020-03-18 DIAGNOSIS — M899 Disorder of bone, unspecified: Secondary | ICD-10-CM | POA: Diagnosis not present

## 2020-06-16 DIAGNOSIS — G2581 Restless legs syndrome: Secondary | ICD-10-CM | POA: Diagnosis not present

## 2020-06-16 DIAGNOSIS — G8929 Other chronic pain: Secondary | ICD-10-CM | POA: Diagnosis not present

## 2020-06-16 DIAGNOSIS — Z803 Family history of malignant neoplasm of breast: Secondary | ICD-10-CM | POA: Diagnosis not present

## 2020-06-16 DIAGNOSIS — I10 Essential (primary) hypertension: Secondary | ICD-10-CM | POA: Diagnosis not present

## 2020-06-16 DIAGNOSIS — R32 Unspecified urinary incontinence: Secondary | ICD-10-CM | POA: Diagnosis not present

## 2020-06-16 DIAGNOSIS — G4700 Insomnia, unspecified: Secondary | ICD-10-CM | POA: Diagnosis not present

## 2020-06-16 DIAGNOSIS — M1990 Unspecified osteoarthritis, unspecified site: Secondary | ICD-10-CM | POA: Diagnosis not present

## 2020-06-16 DIAGNOSIS — M8580 Other specified disorders of bone density and structure, unspecified site: Secondary | ICD-10-CM | POA: Diagnosis not present

## 2020-06-16 DIAGNOSIS — R69 Illness, unspecified: Secondary | ICD-10-CM | POA: Diagnosis not present

## 2020-06-16 DIAGNOSIS — K219 Gastro-esophageal reflux disease without esophagitis: Secondary | ICD-10-CM | POA: Diagnosis not present

## 2020-06-16 DIAGNOSIS — J309 Allergic rhinitis, unspecified: Secondary | ICD-10-CM | POA: Diagnosis not present

## 2020-06-16 DIAGNOSIS — Z7722 Contact with and (suspected) exposure to environmental tobacco smoke (acute) (chronic): Secondary | ICD-10-CM | POA: Diagnosis not present

## 2020-06-24 DIAGNOSIS — K219 Gastro-esophageal reflux disease without esophagitis: Secondary | ICD-10-CM | POA: Diagnosis not present

## 2020-06-24 DIAGNOSIS — R252 Cramp and spasm: Secondary | ICD-10-CM | POA: Diagnosis not present

## 2020-07-19 DIAGNOSIS — U071 COVID-19: Secondary | ICD-10-CM | POA: Diagnosis not present

## 2020-07-24 DIAGNOSIS — Z20822 Contact with and (suspected) exposure to covid-19: Secondary | ICD-10-CM | POA: Diagnosis not present

## 2020-07-29 DIAGNOSIS — H52203 Unspecified astigmatism, bilateral: Secondary | ICD-10-CM | POA: Diagnosis not present

## 2020-07-29 DIAGNOSIS — H524 Presbyopia: Secondary | ICD-10-CM | POA: Diagnosis not present

## 2020-07-29 DIAGNOSIS — H5203 Hypermetropia, bilateral: Secondary | ICD-10-CM | POA: Diagnosis not present

## 2020-07-29 DIAGNOSIS — Z961 Presence of intraocular lens: Secondary | ICD-10-CM | POA: Diagnosis not present

## 2020-08-03 DIAGNOSIS — R252 Cramp and spasm: Secondary | ICD-10-CM | POA: Diagnosis not present

## 2020-08-03 DIAGNOSIS — Z96652 Presence of left artificial knee joint: Secondary | ICD-10-CM | POA: Diagnosis not present

## 2020-08-03 DIAGNOSIS — K219 Gastro-esophageal reflux disease without esophagitis: Secondary | ICD-10-CM | POA: Diagnosis not present

## 2020-08-03 DIAGNOSIS — Z471 Aftercare following joint replacement surgery: Secondary | ICD-10-CM | POA: Diagnosis not present

## 2020-08-09 DIAGNOSIS — R252 Cramp and spasm: Secondary | ICD-10-CM | POA: Diagnosis not present

## 2020-08-12 DIAGNOSIS — L570 Actinic keratosis: Secondary | ICD-10-CM | POA: Diagnosis not present

## 2020-08-12 DIAGNOSIS — L821 Other seborrheic keratosis: Secondary | ICD-10-CM | POA: Diagnosis not present

## 2020-08-12 DIAGNOSIS — L814 Other melanin hyperpigmentation: Secondary | ICD-10-CM | POA: Diagnosis not present

## 2020-08-12 DIAGNOSIS — D1801 Hemangioma of skin and subcutaneous tissue: Secondary | ICD-10-CM | POA: Diagnosis not present

## 2020-08-17 DIAGNOSIS — Z1231 Encounter for screening mammogram for malignant neoplasm of breast: Secondary | ICD-10-CM | POA: Diagnosis not present

## 2020-09-28 ENCOUNTER — Other Ambulatory Visit: Payer: Self-pay | Admitting: Family Medicine

## 2020-09-28 DIAGNOSIS — E538 Deficiency of other specified B group vitamins: Secondary | ICD-10-CM | POA: Diagnosis not present

## 2020-09-28 DIAGNOSIS — K219 Gastro-esophageal reflux disease without esophagitis: Secondary | ICD-10-CM | POA: Diagnosis not present

## 2020-09-28 DIAGNOSIS — Z1389 Encounter for screening for other disorder: Secondary | ICD-10-CM | POA: Diagnosis not present

## 2020-09-28 DIAGNOSIS — Z0000 Encounter for general adult medical examination without abnormal findings: Secondary | ICD-10-CM | POA: Diagnosis not present

## 2020-09-28 DIAGNOSIS — M899 Disorder of bone, unspecified: Secondary | ICD-10-CM | POA: Diagnosis not present

## 2020-09-28 DIAGNOSIS — M8580 Other specified disorders of bone density and structure, unspecified site: Secondary | ICD-10-CM | POA: Diagnosis not present

## 2020-09-28 DIAGNOSIS — G479 Sleep disorder, unspecified: Secondary | ICD-10-CM | POA: Diagnosis not present

## 2020-09-28 DIAGNOSIS — Z23 Encounter for immunization: Secondary | ICD-10-CM | POA: Diagnosis not present

## 2020-09-28 DIAGNOSIS — K912 Postsurgical malabsorption, not elsewhere classified: Secondary | ICD-10-CM | POA: Diagnosis not present

## 2020-09-28 DIAGNOSIS — I10 Essential (primary) hypertension: Secondary | ICD-10-CM | POA: Diagnosis not present

## 2020-10-04 ENCOUNTER — Ambulatory Visit
Admission: RE | Admit: 2020-10-04 | Discharge: 2020-10-04 | Disposition: A | Discharge status: Home / Self Care | Payer: Medicare HMO | Source: Ambulatory Visit | Attending: Sports Medicine | Admitting: Sports Medicine

## 2020-10-04 ENCOUNTER — Other Ambulatory Visit: Payer: Self-pay | Admitting: Sports Medicine

## 2020-10-04 DIAGNOSIS — M5489 Other dorsalgia: Secondary | ICD-10-CM

## 2020-10-04 DIAGNOSIS — R252 Cramp and spasm: Secondary | ICD-10-CM | POA: Diagnosis not present

## 2020-10-04 DIAGNOSIS — M545 Low back pain, unspecified: Secondary | ICD-10-CM | POA: Diagnosis not present

## 2020-10-04 DIAGNOSIS — R29898 Other symptoms and signs involving the musculoskeletal system: Secondary | ICD-10-CM | POA: Diagnosis not present

## 2020-10-07 ENCOUNTER — Other Ambulatory Visit: Payer: Self-pay

## 2020-10-07 ENCOUNTER — Ambulatory Visit
Admission: RE | Admit: 2020-10-07 | Discharge: 2020-10-07 | Disposition: A | Discharge status: Home / Self Care | Payer: Medicare HMO | Source: Ambulatory Visit | Attending: Family Medicine | Admitting: Family Medicine

## 2020-10-07 DIAGNOSIS — M85852 Other specified disorders of bone density and structure, left thigh: Secondary | ICD-10-CM | POA: Diagnosis not present

## 2020-10-07 DIAGNOSIS — Z780 Asymptomatic menopausal state: Secondary | ICD-10-CM | POA: Diagnosis not present

## 2020-10-07 DIAGNOSIS — M810 Age-related osteoporosis without current pathological fracture: Secondary | ICD-10-CM | POA: Diagnosis not present

## 2020-10-07 DIAGNOSIS — M8580 Other specified disorders of bone density and structure, unspecified site: Secondary | ICD-10-CM

## 2020-10-12 DIAGNOSIS — M62551 Muscle wasting and atrophy, not elsewhere classified, right thigh: Secondary | ICD-10-CM | POA: Diagnosis not present

## 2020-10-12 DIAGNOSIS — M545 Low back pain, unspecified: Secondary | ICD-10-CM | POA: Diagnosis not present

## 2020-10-12 DIAGNOSIS — R29898 Other symptoms and signs involving the musculoskeletal system: Secondary | ICD-10-CM | POA: Diagnosis not present

## 2020-10-12 DIAGNOSIS — M25551 Pain in right hip: Secondary | ICD-10-CM | POA: Diagnosis not present

## 2020-10-12 DIAGNOSIS — M25552 Pain in left hip: Secondary | ICD-10-CM | POA: Diagnosis not present

## 2020-10-12 DIAGNOSIS — M62552 Muscle wasting and atrophy, not elsewhere classified, left thigh: Secondary | ICD-10-CM | POA: Diagnosis not present

## 2020-10-15 DIAGNOSIS — M25552 Pain in left hip: Secondary | ICD-10-CM | POA: Diagnosis not present

## 2020-10-15 DIAGNOSIS — M62551 Muscle wasting and atrophy, not elsewhere classified, right thigh: Secondary | ICD-10-CM | POA: Diagnosis not present

## 2020-10-15 DIAGNOSIS — R29898 Other symptoms and signs involving the musculoskeletal system: Secondary | ICD-10-CM | POA: Diagnosis not present

## 2020-10-15 DIAGNOSIS — M25551 Pain in right hip: Secondary | ICD-10-CM | POA: Diagnosis not present

## 2020-10-15 DIAGNOSIS — M62552 Muscle wasting and atrophy, not elsewhere classified, left thigh: Secondary | ICD-10-CM | POA: Diagnosis not present

## 2020-10-15 DIAGNOSIS — M545 Low back pain, unspecified: Secondary | ICD-10-CM | POA: Diagnosis not present

## 2020-10-19 DIAGNOSIS — M810 Age-related osteoporosis without current pathological fracture: Secondary | ICD-10-CM | POA: Diagnosis not present

## 2020-10-19 DIAGNOSIS — M25552 Pain in left hip: Secondary | ICD-10-CM | POA: Diagnosis not present

## 2020-10-19 DIAGNOSIS — R29898 Other symptoms and signs involving the musculoskeletal system: Secondary | ICD-10-CM | POA: Diagnosis not present

## 2020-10-19 DIAGNOSIS — M545 Low back pain, unspecified: Secondary | ICD-10-CM | POA: Diagnosis not present

## 2020-10-19 DIAGNOSIS — M25551 Pain in right hip: Secondary | ICD-10-CM | POA: Diagnosis not present

## 2020-10-19 DIAGNOSIS — M62551 Muscle wasting and atrophy, not elsewhere classified, right thigh: Secondary | ICD-10-CM | POA: Diagnosis not present

## 2020-10-19 DIAGNOSIS — M62552 Muscle wasting and atrophy, not elsewhere classified, left thigh: Secondary | ICD-10-CM | POA: Diagnosis not present

## 2020-11-01 DIAGNOSIS — G4762 Sleep related leg cramps: Secondary | ICD-10-CM | POA: Diagnosis not present

## 2020-11-03 DIAGNOSIS — Z23 Encounter for immunization: Secondary | ICD-10-CM | POA: Diagnosis not present

## 2020-12-13 NOTE — Progress Notes (Signed)
Office Visit Note  Patient: Laura Bean             Date of Birth: 13-Apr-1950           MRN: [id number]             PCP: Gaynelle Arabian, MD Referring: Gaynelle Arabian, MD Visit Date: 12/14/2020   Subjective:  New Patient (Initial Visit) (Reaction to [facility]ney [facility]ys Density results )   History of Present Illness: Laura Bean is a 70 y.o. female here for osteoporosis. She has a medical history of nephrolithiasis, bariatric surgery, scoliosis, osteoarthritis with left knee replacement, and degenerative disc disease of lumbar spine. She was diagnosed with osteoporosis based on forearm bone density of -3.0 on DEXA in October with stable osteopenia on hip BMD in 2017, 2019, and 2022 scans reviewed. She started ibandronate for this but did not tolerate this due to developing body pains in multiple areas within a day mostly improved after 36 hours but some ongoing increased pain until now.  She feels mostly back to baseline thinks areas where she had existing pain from osteoarthritis symptoms remained more active since this occurred. She takes vitamin D supplementation regular has not had any low levels for longer than she can recall.  She generally does not have much GI problem or diarrhea or malabsorption with her history of bariatric surgery unless she eats too quickly.  She is a frequent walker taking care of 2 dogs.  Her mother has history of 2 hip fractures and one vertebral fracture.  Her worst current joint pain at the left shoulder bothering her her activity has been increased lately helping caregiving for her mother.  Labs reviewed 09/2020 CBC unremarkable CMP unremarkable Vit D 79.8  Imaging reviewed 10/07/20 DG Mobile Bone Density Left forearm   0.512  -3.0 Left femur neck  0.720  -1.2  10/17/17 DEXA - Lunar Right femur neck  0.781  -1.8 LEft femur neck   -0.97  06/27/19 3 Phase Bone Scan IMPRESSION: Mildly increased blood flow and blood pool to periarticular regions of the  LEFT knee. Focal increased delayed tracer uptake in the medial and lateral LEFT tibial plateau adjacent to the tibial component of the LEFT knee prosthesis suspicious for aseptic loosening or less likely infection of the prosthesis.  Activities of Daily Living:  Patient reports morning stiffness for 0  none .   Patient Reports nocturnal pain.  Difficulty dressing/grooming: Denies Difficulty climbing stairs: Reports Difficulty getting out of chair: Reports Difficulty using hands for taps, buttons, cutlery, and/or writing: Reports  Review of Systems  Constitutional:  Positive for fatigue.  HENT:  Positive for mouth dryness.   Eyes:  Positive for dryness.  Respiratory:  Negative for shortness of breath.   Cardiovascular:  Positive for swelling in legs/feet.  Gastrointestinal:  Negative for constipation.  Endocrine: Positive for cold intolerance.  Genitourinary:  Negative for difficulty urinating.  Musculoskeletal:  Positive for joint pain, gait problem, joint pain, joint swelling, muscle weakness and muscle tenderness.  Skin:  Negative for rash.  Allergic/Immunologic: Negative for susceptible to infections.  Neurological:  Positive for weakness.  Hematological:  Positive for bruising/bleeding tendency.  Psychiatric/Behavioral:  Negative for sleep disturbance.    PMFS History:  Patient Active Problem List   Diagnosis Date Noted   Degeneration of lumbar intervertebral disc 12/14/2020   Diverticulosis of colon 12/14/2020   Essential hypertension 12/14/2020   Gastroesophageal reflux disease 12/14/2020   History of depression 12/14/2020   Kidney stone 12/14/2020  Osteoporosis 12/14/2020   Obesity 12/14/2020   Postsurgical malabsorption, not elsewhere classified 12/14/2020   Primary osteoarthritis, unspecified ankle and foot 12/14/2020   Severe major depression, single episode, without psychotic features ([place]) 12/14/2020   Sleep disturbance 12/14/2020   Vitamin B12 deficiency (non  anemic) 12/14/2020   Pain in left shoulder 12/14/2020   Painful total knee replacement, left ([place]) 08/20/2019   Primary osteoarthritis of left knee 08/14/2016   Degenerative arthritis of left knee 08/11/2016    Past Medical History:  Diagnosis Date   Arthritis    Depression    Family history of adverse reaction to anesthesia    ? father confusion with anesthesia when older   GERD (gastroesophageal reflux disease)    occ   History of kidney stones    Hypertension     Family History  Problem Relation Age of Onset   Aortic stenosis Mother    Arrhythmia Mother    Breast cancer Mother    Osteoarthritis Mother    Heart disease Mother    Aortic stenosis Father    Melanoma Father    Osteoarthritis Father    Breast cancer Sister    Past Surgical History:  Procedure Laterality Date   ADENOIDECTOMY     child   EYE SURGERY     cataract   ROUX-EN-Y GASTRIC BYPASS  2002   SINUS EXPLORATION     THIGH LIFT     outer   TOTAL KNEE ARTHROPLASTY Left 08/14/2016   Procedure: TOTAL KNEE ARTHROPLASTY;  Surgeon: Frederik Pear, MD;  Location: [facility];  Service: Orthopedics;  Laterality: Left;   TOTAL KNEE REVISION Left 08/25/2019   Procedure: LEFT TOTAL KNEE REVISION;  Surgeon: Frederik Pear, MD;  Location: [facility];  Service: Orthopedics;  Laterality: Left;   TRIGGER FINGER RELEASE Right 11/01/2017   Procedure: RIGHT THUMB TRIGGER RELEASE;  Surgeon: [name], MD;  Location: [facility];  Service: Orthopedics;  Laterality: Right;   Social History   Social History Narrative   Not on file   Immunization History  Administered Date(s) Administered   PFIZER(Purple Top)SARS-COV-2 Vaccination 01/24/2019, 02/14/2019, 05/22/2019, 09/18/2019, 04/03/2020   Pfizer Covid-19 Vaccine Bivalent Booster 49yrs & up 11/03/2020     Objective: Vital Signs: BP 108/70 (BP Location: Right Arm, Patient Position: Sitting, Cuff Size: Normal)   Pulse 67   Resp 16   Ht 5' 3.5" (1.613 m)   Wt 172 lb  (78 kg)   BMI 29.99 kg/m    Physical Exam Cardiovascular:     Rate and Rhythm: Normal rate and regular rhythm.  Pulmonary:     Effort: Pulmonary effort is normal.     Breath sounds: Normal breath sounds.  Musculoskeletal:     Right lower leg: No edema.     Left lower leg: No edema.  Skin:    General: Skin is warm and dry.     Findings: No rash.  Neurological:     General: No focal deficit present.     Mental Status: She is alert.  Psychiatric:        Mood and Affect: Mood normal.     Musculoskeletal Exam:  Neck full ROM some crepitus with lateral rotation Left shoulder pain with abduction starting near horizontal Elbows full ROM no tenderness or swelling Wrists full ROM no tenderness or swelling Fingers full ROM no tenderness or swelling Knees full ROM no tenderness or swelling Ankles full ROM no tenderness or swelling    Investigation: No additional findings.  Imaging: No results found.  Recent Labs: Lab Results  Component Value Date   WBC 6.2 08/14/2019   HGB 13.0 08/14/2019   PLT 253 08/14/2019   NA 141 08/14/2019   K 4.9 08/14/2019   CL 104 08/14/2019   CO2 30 08/14/2019   GLUCOSE 104 (H) 08/14/2019   BUN 27 (H) 08/14/2019   CREATININE 1.04 (H) 08/14/2019   BILITOT 0.6 05/06/2009   ALKPHOS 71 05/06/2009   AST 16 05/06/2009   ALT 13 05/06/2009   PROT 4.7 (L) 05/06/2009   ALBUMIN 2.3 (L) 05/06/2009   CALCIUM 9.9 08/14/2019   GFRAA >60 08/14/2019    Speciality Comments: No specialty comments available.  Procedures:  No procedures performed Allergies: Lisinopril and Ibandronic acid   Assessment / Plan:     Visit Diagnoses: Age-related osteoporosis without current pathological fracture  Osteoporosis based on forearm bone density femur bone density in osteopenia range with high calculated FRAX 10-year hip fracture risk of 3.8% and major osteoporotic fracture risk of 17%.  Musculoskeletal pain with ibandronate so would not recommend alternate oral  bisphosphonate treatment she does not have specific indication for anabolic medication.  Plan to start Prolia she is agreeable with this recent labs from September in primary care clinic were all acceptable.  Recommended she review patient information on this medicine including rare but severe adverse event risks with atypical femoral fracture for osteonecrosis of the jaw.  We will plan to call later this week to follow-up if no concerns then scheduled for starting treatment.  Degeneration of lumbar intervertebral disc  Reviewed low back arthritis no evidence of compression fracture but does have acquired scoliosis due to degenerative disc disease.  Kidney stone  History of single kidney stone years ago stationary and renal calyx thought to be causing bladder spasms, was broken up with lithotripsy and determined to be calcium oxalate stone.  Chronic left shoulder pain  Shoulder pain appears most consistent with bursitis or rotator cuff arthropathy with some impingement.  AC joint dysfunction also possible but has radiating symptoms to upper arm and painful arc.  Provided some recommended range of motion exercises.  Orders: No orders of the defined types were placed in this encounter.  No orders of the defined types were placed in this encounter.    Follow-Up Instructions: No follow-ups on file.   Collier Salina, MD  Note - This record has been created using [employer].  Chart creation errors have been sought, but may not always  have been located. Such creation errors do not reflect on  the standard of medical care.

## 2020-12-14 ENCOUNTER — Encounter: Payer: Self-pay | Admitting: Internal Medicine

## 2020-12-14 ENCOUNTER — Ambulatory Visit: Payer: Medicare HMO | Admitting: Internal Medicine

## 2020-12-14 ENCOUNTER — Other Ambulatory Visit: Payer: Self-pay

## 2020-12-14 VITALS — BP 108/70 | HR 67 | Resp 16 | Ht 63.5 in | Wt 172.0 lb

## 2020-12-14 DIAGNOSIS — E669 Obesity, unspecified: Secondary | ICD-10-CM | POA: Insufficient documentation

## 2020-12-14 DIAGNOSIS — Z8659 Personal history of other mental and behavioral disorders: Secondary | ICD-10-CM | POA: Insufficient documentation

## 2020-12-14 DIAGNOSIS — N200 Calculus of kidney: Secondary | ICD-10-CM | POA: Diagnosis not present

## 2020-12-14 DIAGNOSIS — E538 Deficiency of other specified B group vitamins: Secondary | ICD-10-CM | POA: Insufficient documentation

## 2020-12-14 DIAGNOSIS — M810 Age-related osteoporosis without current pathological fracture: Secondary | ICD-10-CM | POA: Diagnosis not present

## 2020-12-14 DIAGNOSIS — G8929 Other chronic pain: Secondary | ICD-10-CM

## 2020-12-14 DIAGNOSIS — M5136 Other intervertebral disc degeneration, lumbar region: Secondary | ICD-10-CM | POA: Diagnosis not present

## 2020-12-14 DIAGNOSIS — K912 Postsurgical malabsorption, not elsewhere classified: Secondary | ICD-10-CM | POA: Insufficient documentation

## 2020-12-14 DIAGNOSIS — I10 Essential (primary) hypertension: Secondary | ICD-10-CM | POA: Insufficient documentation

## 2020-12-14 DIAGNOSIS — G479 Sleep disorder, unspecified: Secondary | ICD-10-CM | POA: Insufficient documentation

## 2020-12-14 DIAGNOSIS — F322 Major depressive disorder, single episode, severe without psychotic features: Secondary | ICD-10-CM | POA: Insufficient documentation

## 2020-12-14 DIAGNOSIS — M25512 Pain in left shoulder: Secondary | ICD-10-CM | POA: Insufficient documentation

## 2020-12-14 DIAGNOSIS — M19079 Primary osteoarthritis, unspecified ankle and foot: Secondary | ICD-10-CM | POA: Insufficient documentation

## 2020-12-14 DIAGNOSIS — K219 Gastro-esophageal reflux disease without esophagitis: Secondary | ICD-10-CM | POA: Insufficient documentation

## 2020-12-14 DIAGNOSIS — K5730 Diverticulosis of large intestine without perforation or abscess without bleeding: Secondary | ICD-10-CM | POA: Insufficient documentation

## 2021-01-04 ENCOUNTER — Ambulatory Visit: Payer: Medicare HMO | Admitting: Internal Medicine

## 2021-01-05 DIAGNOSIS — Z888 Allergy status to other drugs, medicaments and biological substances status: Secondary | ICD-10-CM | POA: Diagnosis not present

## 2021-01-05 DIAGNOSIS — Z6830 Body mass index (BMI) 30.0-30.9, adult: Secondary | ICD-10-CM | POA: Diagnosis not present

## 2021-01-05 DIAGNOSIS — R32 Unspecified urinary incontinence: Secondary | ICD-10-CM | POA: Diagnosis not present

## 2021-01-05 DIAGNOSIS — I499 Cardiac arrhythmia, unspecified: Secondary | ICD-10-CM | POA: Diagnosis not present

## 2021-01-05 DIAGNOSIS — Z87891 Personal history of nicotine dependence: Secondary | ICD-10-CM | POA: Diagnosis not present

## 2021-01-05 DIAGNOSIS — G4700 Insomnia, unspecified: Secondary | ICD-10-CM | POA: Diagnosis not present

## 2021-01-05 DIAGNOSIS — Z803 Family history of malignant neoplasm of breast: Secondary | ICD-10-CM | POA: Diagnosis not present

## 2021-01-05 DIAGNOSIS — E669 Obesity, unspecified: Secondary | ICD-10-CM | POA: Diagnosis not present

## 2021-01-05 DIAGNOSIS — I10 Essential (primary) hypertension: Secondary | ICD-10-CM | POA: Diagnosis not present

## 2021-01-05 DIAGNOSIS — G629 Polyneuropathy, unspecified: Secondary | ICD-10-CM | POA: Diagnosis not present

## 2021-01-05 DIAGNOSIS — K219 Gastro-esophageal reflux disease without esophagitis: Secondary | ICD-10-CM | POA: Diagnosis not present

## 2021-01-05 DIAGNOSIS — M810 Age-related osteoporosis without current pathological fracture: Secondary | ICD-10-CM | POA: Diagnosis not present

## 2021-01-05 DIAGNOSIS — Z008 Encounter for other general examination: Secondary | ICD-10-CM | POA: Diagnosis not present

## 2021-01-31 DIAGNOSIS — M7702 Medial epicondylitis, left elbow: Secondary | ICD-10-CM | POA: Diagnosis not present

## 2021-01-31 DIAGNOSIS — M79602 Pain in left arm: Secondary | ICD-10-CM | POA: Diagnosis not present

## 2021-02-07 DIAGNOSIS — M25512 Pain in left shoulder: Secondary | ICD-10-CM | POA: Diagnosis not present

## 2021-02-07 DIAGNOSIS — M25522 Pain in left elbow: Secondary | ICD-10-CM | POA: Diagnosis not present

## 2021-02-14 DIAGNOSIS — M62512 Muscle wasting and atrophy, not elsewhere classified, left shoulder: Secondary | ICD-10-CM | POA: Diagnosis not present

## 2021-02-14 DIAGNOSIS — M25612 Stiffness of left shoulder, not elsewhere classified: Secondary | ICD-10-CM | POA: Diagnosis not present

## 2021-02-14 DIAGNOSIS — M25512 Pain in left shoulder: Secondary | ICD-10-CM | POA: Diagnosis not present

## 2021-02-14 DIAGNOSIS — R293 Abnormal posture: Secondary | ICD-10-CM | POA: Diagnosis not present

## 2021-02-17 DIAGNOSIS — M25612 Stiffness of left shoulder, not elsewhere classified: Secondary | ICD-10-CM | POA: Diagnosis not present

## 2021-02-17 DIAGNOSIS — R293 Abnormal posture: Secondary | ICD-10-CM | POA: Diagnosis not present

## 2021-02-17 DIAGNOSIS — M25512 Pain in left shoulder: Secondary | ICD-10-CM | POA: Diagnosis not present

## 2021-02-17 DIAGNOSIS — M62512 Muscle wasting and atrophy, not elsewhere classified, left shoulder: Secondary | ICD-10-CM | POA: Diagnosis not present

## 2021-02-22 DIAGNOSIS — M25512 Pain in left shoulder: Secondary | ICD-10-CM | POA: Diagnosis not present

## 2021-02-22 DIAGNOSIS — R293 Abnormal posture: Secondary | ICD-10-CM | POA: Diagnosis not present

## 2021-02-22 DIAGNOSIS — M62512 Muscle wasting and atrophy, not elsewhere classified, left shoulder: Secondary | ICD-10-CM | POA: Diagnosis not present

## 2021-02-22 DIAGNOSIS — M25612 Stiffness of left shoulder, not elsewhere classified: Secondary | ICD-10-CM | POA: Diagnosis not present

## 2021-02-23 DIAGNOSIS — R293 Abnormal posture: Secondary | ICD-10-CM | POA: Diagnosis not present

## 2021-02-23 DIAGNOSIS — M62512 Muscle wasting and atrophy, not elsewhere classified, left shoulder: Secondary | ICD-10-CM | POA: Diagnosis not present

## 2021-02-23 DIAGNOSIS — M25512 Pain in left shoulder: Secondary | ICD-10-CM | POA: Diagnosis not present

## 2021-02-23 DIAGNOSIS — M25612 Stiffness of left shoulder, not elsewhere classified: Secondary | ICD-10-CM | POA: Diagnosis not present

## 2021-03-03 DIAGNOSIS — M62512 Muscle wasting and atrophy, not elsewhere classified, left shoulder: Secondary | ICD-10-CM | POA: Diagnosis not present

## 2021-03-03 DIAGNOSIS — R293 Abnormal posture: Secondary | ICD-10-CM | POA: Diagnosis not present

## 2021-03-03 DIAGNOSIS — M25512 Pain in left shoulder: Secondary | ICD-10-CM | POA: Diagnosis not present

## 2021-03-03 DIAGNOSIS — M25612 Stiffness of left shoulder, not elsewhere classified: Secondary | ICD-10-CM | POA: Diagnosis not present

## 2021-03-07 DIAGNOSIS — M62512 Muscle wasting and atrophy, not elsewhere classified, left shoulder: Secondary | ICD-10-CM | POA: Diagnosis not present

## 2021-03-07 DIAGNOSIS — M25612 Stiffness of left shoulder, not elsewhere classified: Secondary | ICD-10-CM | POA: Diagnosis not present

## 2021-03-07 DIAGNOSIS — M25512 Pain in left shoulder: Secondary | ICD-10-CM | POA: Diagnosis not present

## 2021-03-07 DIAGNOSIS — R293 Abnormal posture: Secondary | ICD-10-CM | POA: Diagnosis not present

## 2021-03-09 DIAGNOSIS — R293 Abnormal posture: Secondary | ICD-10-CM | POA: Diagnosis not present

## 2021-03-09 DIAGNOSIS — M25512 Pain in left shoulder: Secondary | ICD-10-CM | POA: Diagnosis not present

## 2021-03-09 DIAGNOSIS — M62512 Muscle wasting and atrophy, not elsewhere classified, left shoulder: Secondary | ICD-10-CM | POA: Diagnosis not present

## 2021-03-09 DIAGNOSIS — M25612 Stiffness of left shoulder, not elsewhere classified: Secondary | ICD-10-CM | POA: Diagnosis not present

## 2021-03-14 DIAGNOSIS — R293 Abnormal posture: Secondary | ICD-10-CM | POA: Diagnosis not present

## 2021-03-14 DIAGNOSIS — M62512 Muscle wasting and atrophy, not elsewhere classified, left shoulder: Secondary | ICD-10-CM | POA: Diagnosis not present

## 2021-03-14 DIAGNOSIS — M25612 Stiffness of left shoulder, not elsewhere classified: Secondary | ICD-10-CM | POA: Diagnosis not present

## 2021-03-14 DIAGNOSIS — M25512 Pain in left shoulder: Secondary | ICD-10-CM | POA: Diagnosis not present

## 2021-03-28 ENCOUNTER — Telehealth: Payer: Self-pay

## 2021-03-28 ENCOUNTER — Other Ambulatory Visit (HOSPITAL_COMMUNITY): Payer: Self-pay

## 2021-03-28 DIAGNOSIS — K219 Gastro-esophageal reflux disease without esophagitis: Secondary | ICD-10-CM | POA: Diagnosis not present

## 2021-03-28 DIAGNOSIS — E538 Deficiency of other specified B group vitamins: Secondary | ICD-10-CM | POA: Diagnosis not present

## 2021-03-28 DIAGNOSIS — I10 Essential (primary) hypertension: Secondary | ICD-10-CM | POA: Diagnosis not present

## 2021-03-28 DIAGNOSIS — Z79899 Other long term (current) drug therapy: Secondary | ICD-10-CM

## 2021-03-28 DIAGNOSIS — K912 Postsurgical malabsorption, not elsewhere classified: Secondary | ICD-10-CM | POA: Diagnosis not present

## 2021-03-28 DIAGNOSIS — G479 Sleep disorder, unspecified: Secondary | ICD-10-CM | POA: Diagnosis not present

## 2021-03-28 DIAGNOSIS — M810 Age-related osteoporosis without current pathological fracture: Secondary | ICD-10-CM | POA: Diagnosis not present

## 2021-03-28 NOTE — Telephone Encounter (Signed)
Patient will need updated CMP and CBC prior to initiating Prolia. Vitamin D on 09/28/20 was wnl 79.8.  Specifically will need CMP within 14 days of first dose ? ?Copay for Prolia through pharmacy benefit is $100. ? ?Will need to call insurance for medical benefit. ? ?[place], [id number] ? ?Dx: Age-related osteoporosis (M80.0) ? ?Has history of bariatric surgery and unable to tolerate ibandronate so would avoid other orals like alendronate, ibrandronate, risedronate (oral bisphosphonates). ? ?Knox Saliva, PharmD, MPH, BCPS ?Clinical Pharmacist (Rheumatology and Pulmonology) ?

## 2021-03-28 NOTE — Telephone Encounter (Signed)
Jasmine from Dr. Andrew Au office called stating Laura Bean is a mutual patient who had an appointment with Dr. Benjamine Mola on 12/14/20.   Jasmine states the patient reported to Dr. Marisue Humble that they discussed starting Prolia and that she would receive a call in about a week to follow-up on starting treatment and never received a call.   ?

## 2021-03-29 ENCOUNTER — Other Ambulatory Visit (HOSPITAL_COMMUNITY): Payer: Self-pay

## 2021-03-29 NOTE — Telephone Encounter (Signed)
Opened in error

## 2021-03-30 NOTE — Telephone Encounter (Signed)
Laura Bean from Dr. Andrew Au office called requesting someone from the office reach out to the patient and give her an update.   ?

## 2021-03-30 NOTE — Telephone Encounter (Signed)
Left message to advise Laura Bean that our pharmacy team is working on completing a [place] for [name] and we will be in touch once that has been completed.  ?

## 2021-03-31 NOTE — Telephone Encounter (Signed)
Initiated pre-certification process via phone. ? ?[id number] has been APPROVED for 2 injections beginning 03/31/2021 and ending 04/01/2022. Approval letter will be sent to scan center. ? ?Auth# [id number] ? ?Per benefits report, pt has active coverage with plan as of 01/03/2020. There is $200 copay associated with In-Network "Outpatient Medical Ancillary", which [place] will pay 100% of the Coinsurance rate. Pt has $4,239.15 remaining out of her $4,500 Individual Coinsurance Limit. This report has also been sent to scan center. ?

## 2021-04-01 ENCOUNTER — Other Ambulatory Visit (HOSPITAL_COMMUNITY): Payer: Self-pay

## 2021-04-01 ENCOUNTER — Telehealth: Payer: Self-pay | Admitting: Pharmacist

## 2021-04-01 MED ORDER — DENOSUMAB 60 MG/ML ~~LOC~~ SOSY
60.0000 mg | PREFILLED_SYRINGE | SUBCUTANEOUS | 0 refills | Status: DC
  Filled 2021-04-01 (×2): qty 1, 180d supply, fill #0

## 2021-04-01 NOTE — Telephone Encounter (Signed)
Patient will need updated CMP within 14 days prior to starting Prolia. This is minimum recommended monitoring per Prolia prescribing information. Patient states she had labs completed on 03/28/21 and is sure that it included calcium. Called Dr. Andrew Au office and requested these lab results be faxed to our clinic. ? ?I reviewed Prolia benefits findings with her. She states she would prefer to come to the clinic and pay $100 copay. I advised that Arvilla Market will be reaching out from [phone] number to collect copay and courier medication to clinic from pharmacy.  ? ?Patient scheduled for first Prolia injection on 04/12/21. ? ?Knox Saliva, PharmD, MPH, BCPS ?Clinical Pharmacist (Rheumatology and Pulmonology) ?

## 2021-04-01 NOTE — Addendum Note (Signed)
Addended by: Cassandria Anger on: 04/01/2021 09:22 AM ? ? Modules accepted: Orders ? ?

## 2021-04-01 NOTE — Telephone Encounter (Signed)
We can contact Ms. Laura Bean about starting the prolia injections if this is already approved now. She had normal labs in September so does not need repeated labs before starting.

## 2021-04-01 NOTE — Telephone Encounter (Signed)
Delivery instructions have been updated in [place], medication will be couriered to [facility] by 04/08/21. ? ?Rx has been processed in [facility] and there is a copay of $100. Payment information has been collected and forwarded to the pharmacy.

## 2021-04-01 NOTE — Telephone Encounter (Signed)
Labs received from [employer]. ?Labs drawn 03/28/21 ?CMET wnl. ?Calcium 10.1 ?Vitamin D 90.1 ?CBC wnl ? ?Lab copy sent to media tab of patient's chart ? ?Knox Saliva, PharmD, MPH, BCPS ?Clinical Pharmacist (Rheumatology and Pulmonology) ?

## 2021-04-11 NOTE — Telephone Encounter (Signed)
Prolia received from [facility]. Placed in refrigerator ? ?Knox Saliva, PharmD, MPH, BCPS ?Clinical Pharmacist (Rheumatology and Pulmonology) ?

## 2021-04-12 ENCOUNTER — Ambulatory Visit (INDEPENDENT_AMBULATORY_CARE_PROVIDER_SITE_OTHER): Payer: Medicare HMO | Admitting: Pharmacist

## 2021-04-12 VITALS — BP 113/76 | HR 96

## 2021-04-12 DIAGNOSIS — Z79899 Other long term (current) drug therapy: Secondary | ICD-10-CM

## 2021-04-12 DIAGNOSIS — Z7689 Persons encountering health services in other specified circumstances: Secondary | ICD-10-CM

## 2021-04-12 DIAGNOSIS — M810 Age-related osteoporosis without current pathological fracture: Secondary | ICD-10-CM | POA: Diagnosis not present

## 2021-04-12 MED ORDER — DENOSUMAB 60 MG/ML ~~LOC~~ SOSY
60.0000 mg | PREFILLED_SYRINGE | Freq: Once | SUBCUTANEOUS | Status: AC
  Administered 2021-04-12: 60 mg via SUBCUTANEOUS
  Filled 2021-04-12: qty 1

## 2021-04-12 NOTE — Progress Notes (Signed)
Pharmacy Note ? ?Subjective:   ?Patient presents to clinic today to start Prolia for age-related osteoporosis. Last seen by Dr. Benjamine Mola on 12/14/20. She has previously taking ibandronate but was unable to tolerated due to body pain in multiple area. ? ?Patient running a fever or have signs/symptoms of infection? No ? ?Patient currently on antibiotics for the treatment of infection? No ? ?Patient had fall in the last 6 months?  Yes  If yes, did it require medical attention? No. Tripped over rug in kitchen and fell forward. She has removed the rug from her home. States her knee still hurts a little and face hurts  ? ?Patient taking calcium 1200 mg daily through diet or supplement and at least 800 units vitamin D? Yes.  ? ?Objective: ?CMP  ?   ?Component Value Date/Time  ? NA 141 08/14/2019 0940  ? K 4.9 08/14/2019 0940  ? CL 104 08/14/2019 0940  ? CO2 30 08/14/2019 0940  ? GLUCOSE 104 (H) 08/14/2019 0940  ? BUN 27 (H) 08/14/2019 0940  ? CREATININE 1.04 (H) 08/14/2019 0940  ? CALCIUM 9.9 08/14/2019 0940  ? PROT 4.7 (L) 05/06/2009 0600  ? ALBUMIN 2.3 (L) 05/06/2009 0600  ? AST 16 05/06/2009 0600  ? ALT 13 05/06/2009 0600  ? ALKPHOS 71 05/06/2009 0600  ? BILITOT 0.6 05/06/2009 0600  ? GFRNONAA 55 (L) 08/14/2019 0940  ? GFRAA >60 08/14/2019 0940  ? ? ?CBC ?   ?Component Value Date/Time  ? WBC 6.2 08/14/2019 0940  ? RBC 4.21 08/14/2019 0940  ? HGB 13.0 08/14/2019 0940  ? HCT 39.9 08/14/2019 0940  ? PLT 253 08/14/2019 0940  ? MCV 94.8 08/14/2019 0940  ? MCH 30.9 08/14/2019 0940  ? MCHC 32.6 08/14/2019 0940  ? RDW 12.5 08/14/2019 0940  ? LYMPHSABS 1.4 08/14/2019 0940  ? MONOABS 0.6 08/14/2019 0940  ? EOSABS 0.1 08/14/2019 0940  ? BASOSABS 0.0 08/14/2019 0940  ? ? ?No results found for: VD25OH ? ?T-score: ?10/07/20 DG Mobile Bone Density ?Left forearm                 0.512   -3.0 ?Left femur neck           0.720   -1.2 ?  ?10/17/17 DEXA - Lunar ?Right femur neck         0.781   -1.8 ?LEft femur neck                        -0.97 ? ?Assessment/Plan:  ?We reviewed Prolia - side effects, potential for hypocalcemia, ONJ. Reviewed reversibility of ONJ and importance of minimum of annual dental checks. She states she does complete annual dental checks. Reviewed importance of scheduling dental procedures before Prolia injections are due (towards end of previous treatment dose period). Reviewed importance of timely q6 month Prolia injection including lab monitoring with each dose. Reviewed risk for atypical fractures if doses are delayed and importance of transitioning therapy if patient experiences intolerable side effects to Prolia. She verbalized understanding to all of the above. ? ?Patient tolerated injection without issue.  ? ?Administrations This Visit   ? ? denosumab (PROLIA) injection 60 mg   ? ? Admin Date ?04/12/2021 Action ?Given Dose ?60 mg Route ?Subcutaneous Administered By ?Cassandria Anger, RPH  ? ?  ?  ? ?  ? ? ?Patient is to return in 10-14 days for labs to monitor for hypocalcemia.  Future orders placed. ?  ?All questions encouraged  and answered.  Instructed patient to call with any further questions or concerns. ?  ?Knox Saliva, PharmD, MPH, BCPS ?Clinical Pharmacist (Rheumatology and Pulmonology) ?

## 2021-04-12 NOTE — Patient Instructions (Addendum)
Please return for labs between 4/21 to 4/25 (10 - 14 days after your injection). We need to recheck your calcium level. ? ?Lab hours are from Monday to Thursday 1:30-4:30pm and Friday 1:30-4pm. ?You do not need an appointment if you come for labs during these times. ? ?Follow-up with Dr. Benjamine Mola in 6 weeks. ?

## 2021-04-22 ENCOUNTER — Other Ambulatory Visit: Payer: Self-pay

## 2021-04-22 DIAGNOSIS — M810 Age-related osteoporosis without current pathological fracture: Secondary | ICD-10-CM

## 2021-04-22 DIAGNOSIS — Z79899 Other long term (current) drug therapy: Secondary | ICD-10-CM

## 2021-04-23 LAB — COMPREHENSIVE METABOLIC PANEL
AG Ratio: 1.8 (calc) (ref 1.0–2.5)
ALT: 11 U/L (ref 6–29)
AST: 17 U/L (ref 10–35)
Albumin: 4 g/dL (ref 3.6–5.1)
Alkaline phosphatase (APISO): 84 U/L (ref 37–153)
BUN: 18 mg/dL (ref 7–25)
CO2: 29 mmol/L (ref 20–32)
Calcium: 8.6 mg/dL (ref 8.6–10.4)
Chloride: 106 mmol/L (ref 98–110)
Creat: 0.84 mg/dL (ref 0.60–1.00)
Globulin: 2.2 g/dL (calc) (ref 1.9–3.7)
Glucose, Bld: 96 mg/dL (ref 65–99)
Potassium: 4.5 mmol/L (ref 3.5–5.3)
Sodium: 140 mmol/L (ref 135–146)
Total Bilirubin: 0.4 mg/dL (ref 0.2–1.2)
Total Protein: 6.2 g/dL (ref 6.1–8.1)

## 2021-04-25 NOTE — Progress Notes (Signed)
Lab looks fine after starting prolia.

## 2021-05-11 NOTE — Progress Notes (Signed)
Office Visit Note  Patient: Laura Bean             Date of Birth: 08/31/1950           MRN: [id number]             PCP: Gaynelle Arabian, MD Referring: Gaynelle Arabian, MD Visit Date: 05/24/2021   Subjective:  Follow-up (Doing good)   History of Present Illness: Laura Bean is a 71 y.o. female here for follow up for osteoporosis.  She started Prolia treatment first dose 4/11.  She had follow-up labs checked later in the month with a normal complete metabolic panel.  She did not experience any injection reaction did not have any bone pain or noticeable symptoms after starting the medication.  She did sustain 1 fall in March tripping on a rug in her home she had some body aches immediately afterwards no serious injury or requiring medical attention.  She is taking calcium and vitamin D daily supplements.  She remains active mostly walking and taking care for her dogs.  Previous HPI 12/14/2020 Laura Bean is a 71 y.o. female here for osteoporosis. She has a medical history of nephrolithiasis, bariatric surgery, scoliosis, osteoarthritis with left knee replacement, and degenerative disc disease of lumbar spine. She was diagnosed with osteoporosis based on forearm bone density of -3.0 on DEXA in October with stable osteopenia on hip BMD in 2017, 2019, and 2022 scans reviewed. She started ibandronate for this but did not tolerate this due to developing body pains in multiple areas within a day mostly improved after 36 hours but some ongoing increased pain until now.  She feels mostly back to baseline thinks areas where she had existing pain from osteoarthritis symptoms remained more active since this occurred. She takes vitamin D supplementation regular has not had any low levels for longer than she can recall.  She generally does not have much GI problem or diarrhea or malabsorption with her history of bariatric surgery unless she eats too quickly.  She is a frequent walker taking care of 2  dogs.  Her mother has history of 2 hip fractures and one vertebral fracture.  Her worst current joint pain at the left shoulder bothering her her activity has been increased lately helping caregiving for her mother.   Labs reviewed 09/2020 CBC unremarkable CMP unremarkable Vit D 79.8   Imaging reviewed 10/07/20 DG Mobile Bone Density Left forearm                 0.512   -3.0 Left femur neck           0.720   -1.2   10/17/17 DEXA - Lunar Right femur neck         0.781   -1.8 LEft femur neck                       -0.97   06/27/19 3 Phase Bone Scan IMPRESSION: Mildly increased blood flow and blood pool to periarticular regions of the LEFT knee. Focal increased delayed tracer uptake in the medial and lateral LEFT tibial plateau adjacent to the tibial component of the LEFT knee prosthesis suspicious for aseptic loosening or less likely infection of the prosthesis.   Review of Systems  Constitutional:  Positive for fatigue.  HENT:  Positive for mouth dryness.   Eyes:  Positive for dryness.  Respiratory:  Negative for shortness of breath.   Cardiovascular:  Negative for swelling in legs/feet.  Gastrointestinal:  Negative for constipation.  Endocrine: Positive for cold intolerance.  Genitourinary:  Negative for difficulty urinating.  Musculoskeletal:  Positive for joint pain, gait problem, joint pain, muscle weakness, morning stiffness and muscle tenderness.  Skin:  Negative for rash.  Allergic/Immunologic: Negative for susceptible to infections.  Neurological:  Positive for numbness and weakness.  Hematological:  Positive for bruising/bleeding tendency.  Psychiatric/Behavioral:  Negative for sleep disturbance.    PMFS History:  Patient Active Problem List   Diagnosis Date Noted   Degeneration of lumbar intervertebral disc 12/14/2020   Diverticulosis of colon 12/14/2020   Essential hypertension 12/14/2020   Gastroesophageal reflux disease 12/14/2020   History of depression  12/14/2020   Kidney stone 12/14/2020   Osteoporosis 12/14/2020   Obesity 12/14/2020   Postsurgical malabsorption, not elsewhere classified 12/14/2020   Primary osteoarthritis, unspecified ankle and foot 12/14/2020   Severe major depression, single episode, without psychotic features ([place]) 12/14/2020   Sleep disturbance 12/14/2020   Vitamin B12 deficiency (non anemic) 12/14/2020   Pain in left shoulder 12/14/2020   Painful total knee replacement, left ([place]) 08/20/2019   Primary osteoarthritis of left knee 08/14/2016   Degenerative arthritis of left knee 08/11/2016    Past Medical History:  Diagnosis Date   Arthritis    Depression    Family history of adverse reaction to anesthesia    ? father confusion with anesthesia when older   GERD (gastroesophageal reflux disease)    occ   History of kidney stones    Hypertension    Osteoporosis     Family History  Problem Relation Age of Onset   Aortic stenosis Mother    Arrhythmia Mother    Breast cancer Mother    Osteoarthritis Mother    Heart disease Mother    Aortic stenosis Father    Melanoma Father    Osteoarthritis Father    Breast cancer Sister    Past Surgical History:  Procedure Laterality Date   ADENOIDECTOMY     child   EYE SURGERY     cataract   ROUX-EN-Y GASTRIC BYPASS  2002   SINUS EXPLORATION     THIGH LIFT     outer   TOTAL KNEE ARTHROPLASTY Left 08/14/2016   Procedure: TOTAL KNEE ARTHROPLASTY;  Surgeon: Frederik Pear, MD;  Location: [place];  Service: Orthopedics;  Laterality: Left;   TOTAL KNEE REVISION Left 08/25/2019   Procedure: LEFT TOTAL KNEE REVISION;  Surgeon: Frederik Pear, MD;  Location: [facility];  Service: Orthopedics;  Laterality: Left;   TRIGGER FINGER RELEASE Right 11/01/2017   Procedure: RIGHT THUMB TRIGGER RELEASE;  Surgeon: [name], MD;  Location: [place];  Service: Orthopedics;  Laterality: Right;   Social History   Social History Narrative   Not on file   Immunization  History  Administered Date(s) Administered   PFIZER(Purple Top)SARS-COV-2 Vaccination 01/24/2019, 02/14/2019, 05/22/2019, 09/18/2019, 04/03/2020   Pfizer Covid-19 Vaccine Bivalent Booster 54yr & up 11/03/2020     Objective: Vital Signs: BP 114/68 (BP Location: Left Arm, Patient Position: Sitting, Cuff Size: Normal)   Pulse 60   Resp 15   Ht '5\' 4"'$  (1.626 m)   Wt 175 lb (79.4 kg)   BMI 30.04 kg/m    Physical Exam Skin:    General: Skin is warm and dry.     Findings: No rash.  Neurological:     Mental Status: She is alert.  Psychiatric:        Mood and Affect: Mood normal.  Musculoskeletal Exam:  Shoulders full ROM no tenderness or swelling Elbows full ROM no tenderness or swelling Wrists full ROM no tenderness or swelling Fingers full ROM no tenderness or swelling Knees full ROM no tenderness or swelling Ankles full ROM no tenderness or swelling    Investigation: No additional findings.  Imaging: No results found.  Recent Labs: Lab Results  Component Value Date   WBC 6.2 08/14/2019   HGB 13.0 08/14/2019   PLT 253 08/14/2019   NA 140 04/22/2021   K 4.5 04/22/2021   CL 106 04/22/2021   CO2 29 04/22/2021   GLUCOSE 96 04/22/2021   BUN 18 04/22/2021   CREATININE 0.84 04/22/2021   BILITOT 0.4 04/22/2021   ALKPHOS 71 05/06/2009   AST 17 04/22/2021   ALT 11 04/22/2021   PROT 6.2 04/22/2021   ALBUMIN 2.3 (L) 05/06/2009   CALCIUM 8.6 04/22/2021   GFRAA >60 08/14/2019    Speciality Comments: No specialty comments available.  Procedures:  No procedures performed Allergies: Lisinopril and Ibandronic acid   Assessment / Plan:     Visit Diagnoses: Age-related osteoporosis without current pathological fracture - prolia injection: 04/12/2021.  She appears to be tolerating prolia okay. Subsequent labs still with normal renal function and calcium last month. Plan to repeat dosing q28month. I think it is reasonable to plan repeat bone density testing October next  year would be about 1.5 years into treatment. One fall during the interval but not does not seem to have significant gait problems.  Kidney stone - History of single kidney stone years ago stationary and renal calyx thought to be causing bladder spasms, was broken up with lithotripsy   No new symptoms on calcium supplementation and starting prolia.  Chronic left shoulder pain - Shoulder pain appears most consistent with bursitis or rotator cuff arthropathy with some impingement.   Currently not in exacerbation she has good range of movement without complaints today.   Orders: No orders of the defined types were placed in this encounter.  No orders of the defined types were placed in this encounter.    Follow-Up Instructions: Return in about 1 year (around 05/25/2022) for OP on prolia f/u 12yr  ChCollier SalinaMD  Note - This record has been created using [employer] Chart creation errors have been sought, but may not always  have been located. Such creation errors do not reflect on  the standard of medical care.

## 2021-05-16 DIAGNOSIS — L299 Pruritus, unspecified: Secondary | ICD-10-CM | POA: Diagnosis not present

## 2021-05-24 ENCOUNTER — Ambulatory Visit: Payer: Medicare HMO | Admitting: Internal Medicine

## 2021-05-24 ENCOUNTER — Encounter: Payer: Self-pay | Admitting: Internal Medicine

## 2021-05-24 VITALS — BP 114/68 | HR 60 | Resp 15 | Ht 64.0 in | Wt 175.0 lb

## 2021-05-24 DIAGNOSIS — M25512 Pain in left shoulder: Secondary | ICD-10-CM | POA: Diagnosis not present

## 2021-05-24 DIAGNOSIS — N200 Calculus of kidney: Secondary | ICD-10-CM | POA: Diagnosis not present

## 2021-05-24 DIAGNOSIS — M810 Age-related osteoporosis without current pathological fracture: Secondary | ICD-10-CM

## 2021-05-24 DIAGNOSIS — M5136 Other intervertebral disc degeneration, lumbar region: Secondary | ICD-10-CM | POA: Diagnosis not present

## 2021-05-24 DIAGNOSIS — G8929 Other chronic pain: Secondary | ICD-10-CM

## 2021-08-01 DIAGNOSIS — Z961 Presence of intraocular lens: Secondary | ICD-10-CM | POA: Diagnosis not present

## 2021-08-01 DIAGNOSIS — H524 Presbyopia: Secondary | ICD-10-CM | POA: Diagnosis not present

## 2021-08-01 DIAGNOSIS — Z83518 Family history of other specified eye disorder: Secondary | ICD-10-CM | POA: Diagnosis not present

## 2021-08-17 DIAGNOSIS — L814 Other melanin hyperpigmentation: Secondary | ICD-10-CM | POA: Diagnosis not present

## 2021-08-17 DIAGNOSIS — L821 Other seborrheic keratosis: Secondary | ICD-10-CM | POA: Diagnosis not present

## 2021-08-17 DIAGNOSIS — D1801 Hemangioma of skin and subcutaneous tissue: Secondary | ICD-10-CM | POA: Diagnosis not present

## 2021-08-17 DIAGNOSIS — L570 Actinic keratosis: Secondary | ICD-10-CM | POA: Diagnosis not present

## 2021-08-18 DIAGNOSIS — Z1231 Encounter for screening mammogram for malignant neoplasm of breast: Secondary | ICD-10-CM | POA: Diagnosis not present

## 2021-09-13 ENCOUNTER — Other Ambulatory Visit (HOSPITAL_COMMUNITY): Payer: Self-pay

## 2021-09-21 ENCOUNTER — Telehealth: Payer: Self-pay | Admitting: Pharmacist

## 2021-09-21 ENCOUNTER — Other Ambulatory Visit (HOSPITAL_COMMUNITY): Payer: Self-pay

## 2021-09-21 DIAGNOSIS — Z79899 Other long term (current) drug therapy: Secondary | ICD-10-CM

## 2021-09-21 DIAGNOSIS — Z5181 Encounter for therapeutic drug level monitoring: Secondary | ICD-10-CM

## 2021-09-21 DIAGNOSIS — M810 Age-related osteoporosis without current pathological fracture: Secondary | ICD-10-CM

## 2021-09-21 NOTE — Telephone Encounter (Signed)
Patient due for next Prolia on 10/09/21. Unable to run test claim (earliest fill date is 09/28/21)  Spoke with patient regarding labs. She will plan to stop by the clinic on 09/23/21 for Prolia labs. Future orders for CBC and CMP placed today.  Knox Saliva, PharmD, MPH, BCPS, CPP Clinical Pharmacist (Rheumatology and Pulmonology)

## 2021-09-22 DIAGNOSIS — Z83518 Family history of other specified eye disorder: Secondary | ICD-10-CM | POA: Diagnosis not present

## 2021-09-22 DIAGNOSIS — Z961 Presence of intraocular lens: Secondary | ICD-10-CM | POA: Diagnosis not present

## 2021-09-23 ENCOUNTER — Other Ambulatory Visit: Payer: Self-pay | Admitting: *Deleted

## 2021-09-23 DIAGNOSIS — M810 Age-related osteoporosis without current pathological fracture: Secondary | ICD-10-CM | POA: Diagnosis not present

## 2021-09-23 DIAGNOSIS — Z5181 Encounter for therapeutic drug level monitoring: Secondary | ICD-10-CM

## 2021-09-24 LAB — COMPREHENSIVE METABOLIC PANEL
AG Ratio: 1.8 (calc) (ref 1.0–2.5)
ALT: 13 U/L (ref 6–29)
AST: 20 U/L (ref 10–35)
Albumin: 4.1 g/dL (ref 3.6–5.1)
Alkaline phosphatase (APISO): 62 U/L (ref 37–153)
BUN: 25 mg/dL (ref 7–25)
CO2: 31 mmol/L (ref 20–32)
Calcium: 9.6 mg/dL (ref 8.6–10.4)
Chloride: 103 mmol/L (ref 98–110)
Creat: 0.92 mg/dL (ref 0.60–1.00)
Globulin: 2.3 g/dL (calc) (ref 1.9–3.7)
Glucose, Bld: 98 mg/dL (ref 65–99)
Potassium: 5 mmol/L (ref 3.5–5.3)
Sodium: 139 mmol/L (ref 135–146)
Total Bilirubin: 0.4 mg/dL (ref 0.2–1.2)
Total Protein: 6.4 g/dL (ref 6.1–8.1)

## 2021-09-24 LAB — CBC WITH DIFFERENTIAL/PLATELET
Absolute Monocytes: 447 cells/uL (ref 200–950)
Basophils Absolute: 31 cells/uL (ref 0–200)
Basophils Relative: 0.6 %
Eosinophils Absolute: 109 cells/uL (ref 15–500)
Eosinophils Relative: 2.1 %
HCT: 38.8 % (ref 35.0–45.0)
Hemoglobin: 13.1 g/dL (ref 11.7–15.5)
Lymphs Abs: 1331 cells/uL (ref 850–3900)
MCH: 30.3 pg (ref 27.0–33.0)
MCHC: 33.8 g/dL (ref 32.0–36.0)
MCV: 89.6 fL (ref 80.0–100.0)
MPV: 9.4 fL (ref 7.5–12.5)
Monocytes Relative: 8.6 %
Neutro Abs: 3281 cells/uL (ref 1500–7800)
Neutrophils Relative %: 63.1 %
Platelets: 235 10*3/uL (ref 140–400)
RBC: 4.33 10*6/uL (ref 3.80–5.10)
RDW: 12.6 % (ref 11.0–15.0)
Total Lymphocyte: 25.6 %
WBC: 5.2 10*3/uL (ref 3.8–10.8)

## 2021-09-26 NOTE — Telephone Encounter (Signed)
CBC and CMP on 09/23/21 wnl. Will adjudicate claim on 09/28/21 to find copay through pharmacy benefit before calling pt and scheduling Prolia appt  Knox Saliva, PharmD, MPH, BCPS, CPP Clinical Pharmacist (Rheumatology and Pulmonology)

## 2021-09-28 ENCOUNTER — Other Ambulatory Visit (HOSPITAL_COMMUNITY): Payer: Self-pay

## 2021-09-28 MED ORDER — DENOSUMAB 60 MG/ML ~~LOC~~ SOSY
60.0000 mg | PREFILLED_SYRINGE | SUBCUTANEOUS | 0 refills | Status: DC
  Filled 2021-09-28: qty 1, 180d supply, fill #0

## 2021-09-28 NOTE — Telephone Encounter (Signed)
Copay for Prolia through pharmacy benefit is $100. Spoke with patient. She is comfortable with this copay. Rx sent to [facility] to be couriered to clinic by 10/11/21.  Patient's Prolia appt is scheduled for 10/11/21  Knox Saliva, PharmD, MPH, BCPS, CPP Clinical Pharmacist (Rheumatology and Pulmonology)

## 2021-09-29 DIAGNOSIS — M791 Myalgia, unspecified site: Secondary | ICD-10-CM | POA: Diagnosis not present

## 2021-09-29 DIAGNOSIS — M810 Age-related osteoporosis without current pathological fracture: Secondary | ICD-10-CM | POA: Diagnosis not present

## 2021-09-29 DIAGNOSIS — Z1331 Encounter for screening for depression: Secondary | ICD-10-CM | POA: Diagnosis not present

## 2021-09-29 DIAGNOSIS — K219 Gastro-esophageal reflux disease without esophagitis: Secondary | ICD-10-CM | POA: Diagnosis not present

## 2021-09-29 DIAGNOSIS — K912 Postsurgical malabsorption, not elsewhere classified: Secondary | ICD-10-CM | POA: Diagnosis not present

## 2021-09-29 DIAGNOSIS — E538 Deficiency of other specified B group vitamins: Secondary | ICD-10-CM | POA: Diagnosis not present

## 2021-09-29 DIAGNOSIS — H547 Unspecified visual loss: Secondary | ICD-10-CM | POA: Diagnosis not present

## 2021-09-29 DIAGNOSIS — Z0000 Encounter for general adult medical examination without abnormal findings: Secondary | ICD-10-CM | POA: Diagnosis not present

## 2021-09-29 DIAGNOSIS — I10 Essential (primary) hypertension: Secondary | ICD-10-CM | POA: Diagnosis not present

## 2021-09-29 DIAGNOSIS — G479 Sleep disorder, unspecified: Secondary | ICD-10-CM | POA: Diagnosis not present

## 2021-09-29 DIAGNOSIS — Z23 Encounter for immunization: Secondary | ICD-10-CM | POA: Diagnosis not present

## 2021-09-29 DIAGNOSIS — R519 Headache, unspecified: Secondary | ICD-10-CM | POA: Diagnosis not present

## 2021-10-04 DIAGNOSIS — Z1211 Encounter for screening for malignant neoplasm of colon: Secondary | ICD-10-CM | POA: Diagnosis not present

## 2021-10-05 ENCOUNTER — Other Ambulatory Visit (HOSPITAL_COMMUNITY): Payer: Self-pay

## 2021-10-05 NOTE — Progress Notes (Signed)
Pharmacy Note  Subjective:   Patient presents to clinic today to receive bi-annual dose of Prolia. Patient's last dose of Prolia was on 04/12/2021  Patient running a fever or have signs/symptoms of infection? No  Patient currently on antibiotics for the treatment of infection? No  Patient had fall in the last 6 months?  Yes. She fell in her front yard. Tripped over flowerbed barrier (which is only a few inches off of hte ground). She fell on her glutes. This incident did not require medical attention.  Patient taking calcium 1200 mg daily through diet or supplement and at least 800 units vitamin D? Yes  Objective: CMP     Component Value Date/Time   NA 139 09/23/2021 1340   K 5.0 09/23/2021 1340   CL 103 09/23/2021 1340   CO2 31 09/23/2021 1340   GLUCOSE 98 09/23/2021 1340   BUN 25 09/23/2021 1340   CREATININE 0.92 09/23/2021 1340   CALCIUM 9.6 09/23/2021 1340   PROT 6.4 09/23/2021 1340   ALBUMIN 2.3 (L) 05/06/2009 0600   AST 20 09/23/2021 1340   ALT 13 09/23/2021 1340   ALKPHOS 71 05/06/2009 0600   BILITOT 0.4 09/23/2021 1340   GFRNONAA 55 (L) 08/14/2019 0940   GFRAA >60 08/14/2019 0940    CBC    Component Value Date/Time   WBC 5.2 09/23/2021 1340   RBC 4.33 09/23/2021 1340   HGB 13.1 09/23/2021 1340   HCT 38.8 09/23/2021 1340   PLT 235 09/23/2021 1340   MCV 89.6 09/23/2021 1340   MCH 30.3 09/23/2021 1340   MCHC 33.8 09/23/2021 1340   RDW 12.6 09/23/2021 1340   LYMPHSABS 1,331 09/23/2021 1340   MONOABS 0.6 08/14/2019 0940   EOSABS 109 09/23/2021 1340   BASOSABS 31 09/23/2021 1340    No results found for: "VD25OH"  10/07/20 DG Mobile Bone Density Left forearm                 0.512   -3.0 Left femur neck           0.720   -1.2   10/17/17 DEXA - Lunar Right femur neck         0.781   -1.8 LEft femur neck                       -0.97  Assessment/Plan:   Reviewed importance of adequate dietary intake of calcium in addition to supplementation due to risk of  hypocalcemia with Prolia.   Patient tolerated injection without issue.   Administrations This Visit     denosumab (PROLIA) injection 60 mg     Admin Date 10/11/2021 Action Given Dose 60 mg Route Subcutaneous Administered By Cassandria Anger, RPH-CPP           Patient's next Prolia dose is due on 04/09/2022.  Patient is due for updated DEXA in October 2024.   All questions encouraged and answered.  Instructed patient to call with any further questions or concerns.  Knox Saliva, PharmD, MPH, BCPS, CPP Clinical Pharmacist (Rheumatology and Pulmonology)

## 2021-10-06 ENCOUNTER — Other Ambulatory Visit (HOSPITAL_COMMUNITY): Payer: Self-pay

## 2021-10-06 NOTE — Telephone Encounter (Signed)
Prolia received from [facility]. Placed in refrigerator  Muaz Shorey, PharmD, MPH, BCPS, CPP Clinical Pharmacist (Rheumatology and Pulmonology) 

## 2021-10-11 ENCOUNTER — Ambulatory Visit: Payer: Medicare HMO | Attending: Internal Medicine | Admitting: Pharmacist

## 2021-10-11 VITALS — BP 129/71 | HR 56

## 2021-10-11 DIAGNOSIS — M810 Age-related osteoporosis without current pathological fracture: Secondary | ICD-10-CM | POA: Diagnosis not present

## 2021-10-11 DIAGNOSIS — Z7689 Persons encountering health services in other specified circumstances: Secondary | ICD-10-CM

## 2021-10-11 MED ORDER — DENOSUMAB 60 MG/ML ~~LOC~~ SOSY
60.0000 mg | PREFILLED_SYRINGE | Freq: Once | SUBCUTANEOUS | Status: AC
  Administered 2021-10-11: 60 mg via SUBCUTANEOUS

## 2021-10-11 NOTE — Patient Instructions (Signed)
Next Prolia is due 04/09/2022. Will need labs within a month prior to this (CBC and CMP)  Next bone density is due October 2023

## 2021-10-26 DIAGNOSIS — H18523 Epithelial (juvenile) corneal dystrophy, bilateral: Secondary | ICD-10-CM | POA: Diagnosis not present

## 2021-11-11 IMAGING — DX DG CHEST 2V
2 series · 2 of 2 positions shown · non-contrast
Comparison: 08/08/2016

CLINICAL DATA: Preop chest x-ray.

EXAM:
CHEST - 2 VIEW

[chest pa]
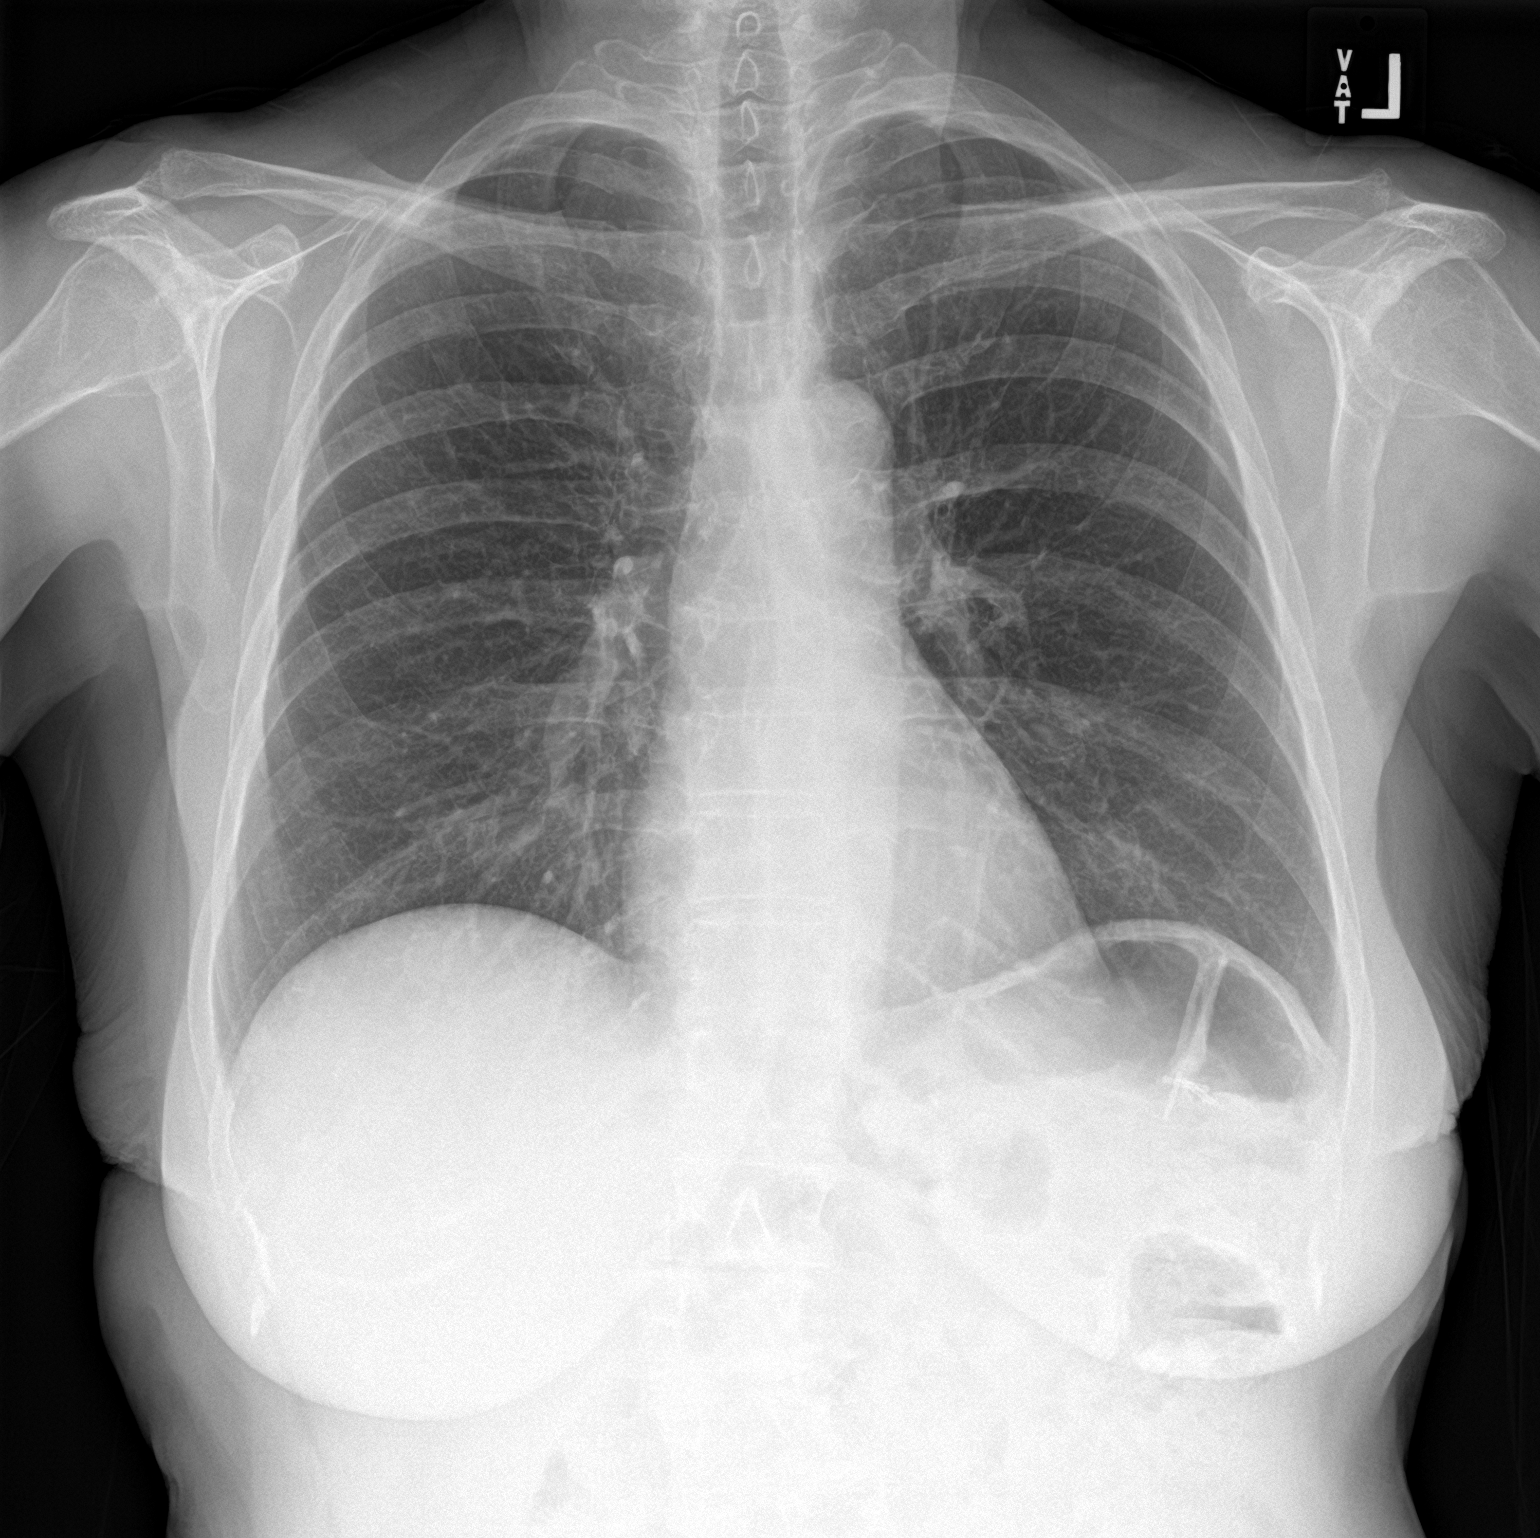

[chest lat]
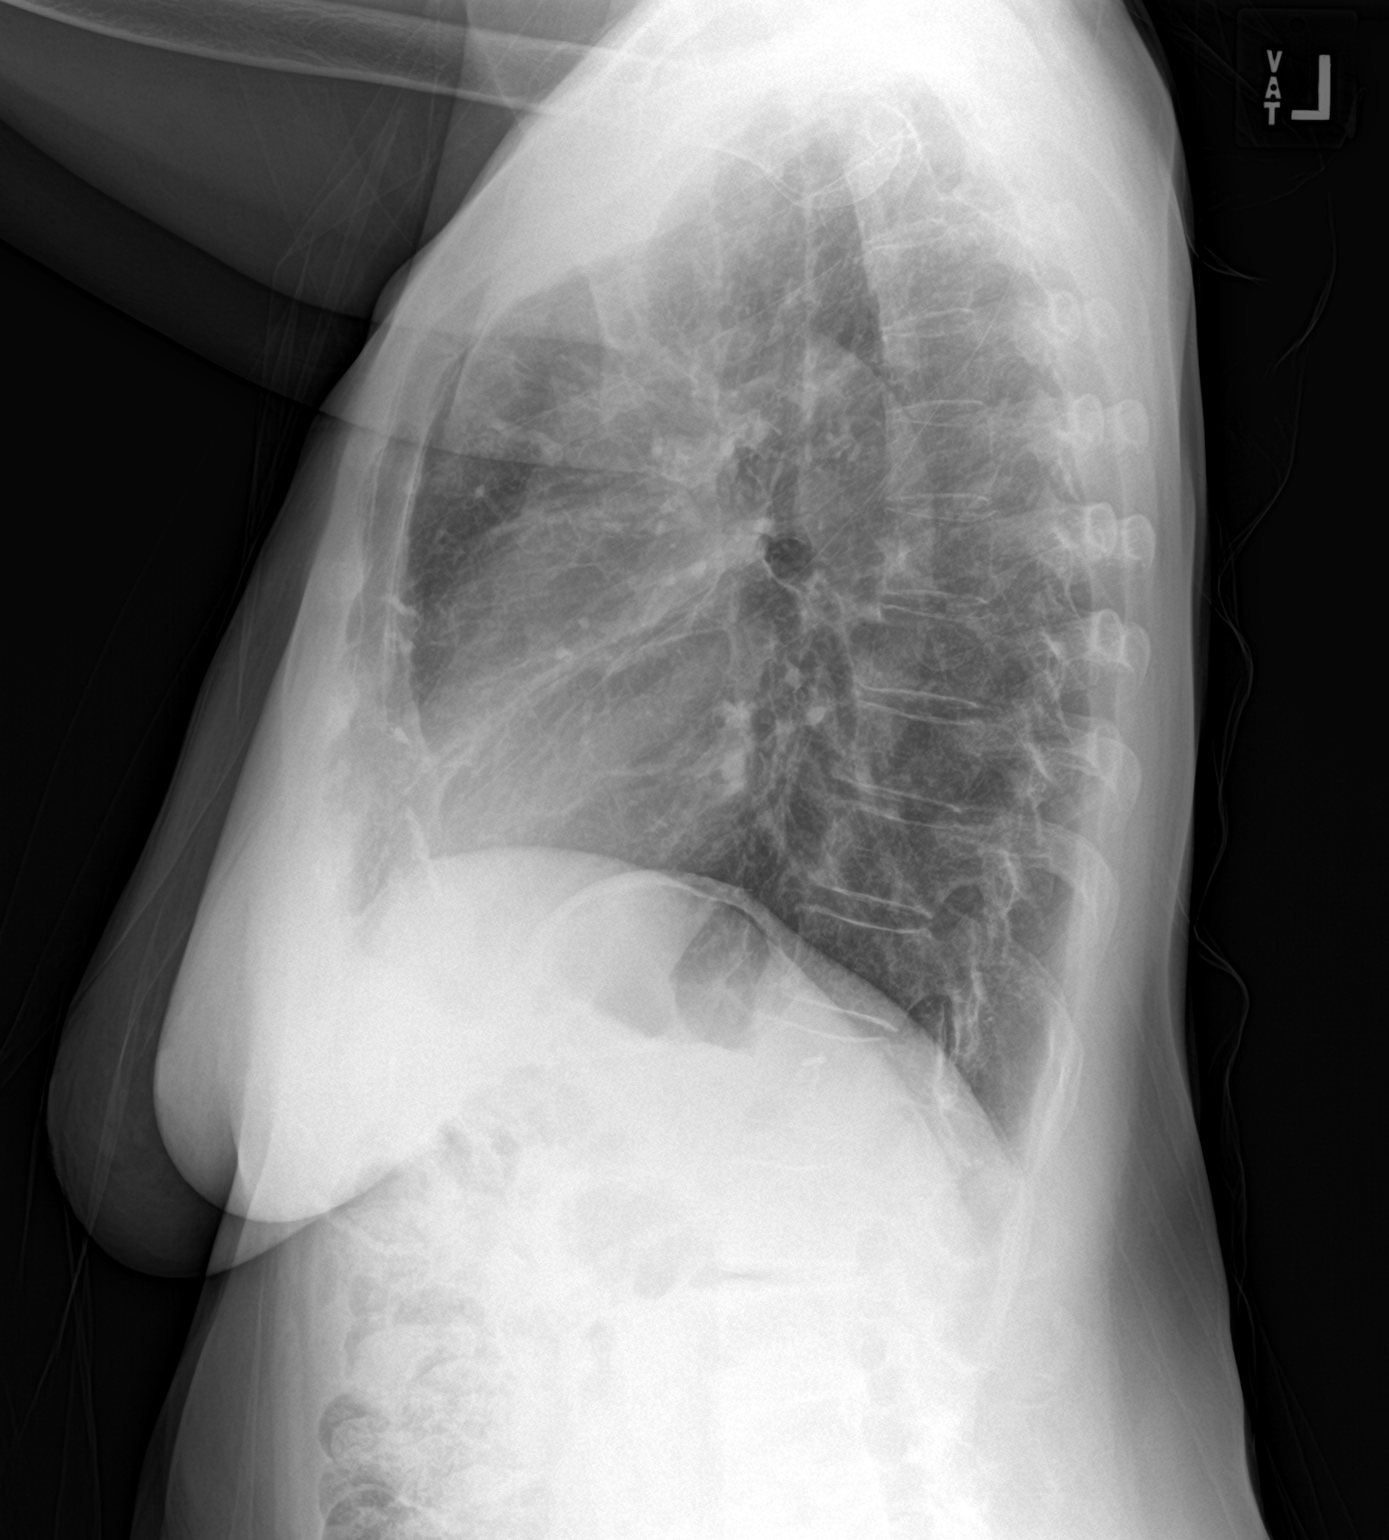

[2 of 2 positions shown; findings below may reference images not displayed]

FINDINGS: The heart size and mediastinal contours are within normal limits.
Both lungs are clear. The visualized skeletal structures are
unremarkable.
IMPRESSION: No active cardiopulmonary disease.

## 2021-11-29 DIAGNOSIS — H18523 Epithelial (juvenile) corneal dystrophy, bilateral: Secondary | ICD-10-CM | POA: Diagnosis not present

## 2021-11-29 DIAGNOSIS — Z9889 Other specified postprocedural states: Secondary | ICD-10-CM | POA: Diagnosis not present

## 2021-12-05 DIAGNOSIS — M25512 Pain in left shoulder: Secondary | ICD-10-CM | POA: Diagnosis not present

## 2022-01-06 DIAGNOSIS — H18521 Epithelial (juvenile) corneal dystrophy, right eye: Secondary | ICD-10-CM | POA: Diagnosis not present

## 2022-02-13 DIAGNOSIS — H9201 Otalgia, right ear: Secondary | ICD-10-CM | POA: Diagnosis not present

## 2022-02-13 DIAGNOSIS — J3489 Other specified disorders of nose and nasal sinuses: Secondary | ICD-10-CM | POA: Diagnosis not present

## 2022-02-15 DIAGNOSIS — Z0100 Encounter for examination of eyes and vision without abnormal findings: Secondary | ICD-10-CM | POA: Diagnosis not present

## 2022-02-22 DIAGNOSIS — M25512 Pain in left shoulder: Secondary | ICD-10-CM | POA: Diagnosis not present

## 2022-03-08 DIAGNOSIS — M25512 Pain in left shoulder: Secondary | ICD-10-CM | POA: Diagnosis not present

## 2022-03-16 ENCOUNTER — Other Ambulatory Visit (HOSPITAL_COMMUNITY): Payer: Self-pay

## 2022-03-16 DIAGNOSIS — M25512 Pain in left shoulder: Secondary | ICD-10-CM | POA: Diagnosis not present

## 2022-03-21 DIAGNOSIS — M25512 Pain in left shoulder: Secondary | ICD-10-CM | POA: Diagnosis not present

## 2022-03-23 DIAGNOSIS — M25512 Pain in left shoulder: Secondary | ICD-10-CM | POA: Diagnosis not present

## 2022-03-27 DIAGNOSIS — M25512 Pain in left shoulder: Secondary | ICD-10-CM | POA: Diagnosis not present

## 2022-03-29 ENCOUNTER — Other Ambulatory Visit (HOSPITAL_COMMUNITY): Payer: Self-pay

## 2022-03-29 DIAGNOSIS — M25512 Pain in left shoulder: Secondary | ICD-10-CM | POA: Diagnosis not present

## 2022-03-30 ENCOUNTER — Other Ambulatory Visit (HOSPITAL_COMMUNITY): Payer: Self-pay

## 2022-03-30 DIAGNOSIS — I10 Essential (primary) hypertension: Secondary | ICD-10-CM | POA: Diagnosis not present

## 2022-03-30 DIAGNOSIS — K219 Gastro-esophageal reflux disease without esophagitis: Secondary | ICD-10-CM | POA: Diagnosis not present

## 2022-03-30 DIAGNOSIS — G479 Sleep disorder, unspecified: Secondary | ICD-10-CM | POA: Diagnosis not present

## 2022-03-30 DIAGNOSIS — M810 Age-related osteoporosis without current pathological fracture: Secondary | ICD-10-CM | POA: Diagnosis not present

## 2022-03-30 DIAGNOSIS — K912 Postsurgical malabsorption, not elsewhere classified: Secondary | ICD-10-CM | POA: Diagnosis not present

## 2022-03-30 DIAGNOSIS — E538 Deficiency of other specified B group vitamins: Secondary | ICD-10-CM | POA: Diagnosis not present

## 2022-04-03 ENCOUNTER — Other Ambulatory Visit: Payer: Self-pay

## 2022-04-03 DIAGNOSIS — M25512 Pain in left shoulder: Secondary | ICD-10-CM | POA: Diagnosis not present

## 2022-04-05 ENCOUNTER — Other Ambulatory Visit (HOSPITAL_COMMUNITY): Payer: Self-pay

## 2022-04-05 DIAGNOSIS — M25512 Pain in left shoulder: Secondary | ICD-10-CM | POA: Diagnosis not present

## 2022-04-10 ENCOUNTER — Telehealth: Payer: Self-pay | Admitting: Pharmacist

## 2022-04-10 ENCOUNTER — Other Ambulatory Visit (HOSPITAL_COMMUNITY): Payer: Self-pay

## 2022-04-10 DIAGNOSIS — M25512 Pain in left shoulder: Secondary | ICD-10-CM | POA: Diagnosis not present

## 2022-04-10 DIAGNOSIS — M810 Age-related osteoporosis without current pathological fracture: Secondary | ICD-10-CM

## 2022-04-10 DIAGNOSIS — Z79899 Other long term (current) drug therapy: Secondary | ICD-10-CM

## 2022-04-10 DIAGNOSIS — E559 Vitamin D deficiency, unspecified: Secondary | ICD-10-CM

## 2022-04-10 NOTE — Telephone Encounter (Signed)
Patient due for Prolia on 04/09/2022. Has OV on 04/13/22. Will need updated labs at that visit. Future order placed for CBC, CMP, Vitamin D  [name], PharmD, MPH, BCPS, CPP Clinical Pharmacist (Rheumatology and Pulmonology)

## 2022-04-12 DIAGNOSIS — M25512 Pain in left shoulder: Secondary | ICD-10-CM | POA: Diagnosis not present

## 2022-04-12 NOTE — Progress Notes (Signed)
Office Visit Note  Patient: Laura Bean             Date of Birth: February 26, 1950           MRN: [id number]             PCP: Blair Heys, MD Referring: Blair Heys, MD Visit Date: 04/13/2022   Subjective:  Follow-up   History of Present Illness: Laura Bean is a 72 y.o. female here for follow up for osteoporosis on prolia started since last April due for next treatment at this time. No intolerance or other problems with the first 2 injections. She is working with physical therapy for shoulder pain not much improvement yet this year but previously had good benefit from this. Stays active walking her dogs multiple times daily.  She had recent lab work checked with PCP office about 2 weeks ago that was fine for her Prolia.  Previous HPI 05/24/21 Laura Bean is a 72 y.o. female here for follow up for osteoporosis.  She started Prolia treatment first dose 4/11.  She had follow-up labs checked later in the month with a normal complete metabolic panel.  She did not experience any injection reaction did not have any bone pain or noticeable symptoms after starting the medication.  She did sustain 1 fall in March tripping on a rug in her home she had some body aches immediately afterwards no serious injury or requiring medical attention.  She is taking calcium and vitamin D daily supplements.  She remains active mostly walking and taking care for her dogs.   Previous HPI 12/14/2020 Laura Bean is a 72 y.o. female here for osteoporosis. She has a medical history of nephrolithiasis, bariatric surgery, scoliosis, osteoarthritis with left knee replacement, and degenerative disc disease of lumbar spine. She was diagnosed with osteoporosis based on forearm bone density of -3.0 on DEXA in October with stable osteopenia on hip BMD in 2017, 2019, and 2022 scans reviewed. She started ibandronate for this but did not tolerate this due to developing body pains in multiple areas within a day mostly  improved after 36 hours but some ongoing increased pain until now.  She feels mostly back to baseline thinks areas where she had existing pain from osteoarthritis symptoms remained more active since this occurred. She takes vitamin D supplementation regular has not had any low levels for longer than she can recall.  She generally does not have much GI problem or diarrhea or malabsorption with her history of bariatric surgery unless she eats too quickly.  She is a frequent walker taking care of 2 dogs.  Her mother has history of 2 hip fractures and one vertebral fracture.  Her worst current joint pain at the left shoulder bothering her her activity has been increased lately helping caregiving for her mother.   Labs reviewed 09/2020 CBC unremarkable CMP unremarkable Vit D 79.8   Imaging reviewed 10/07/20 DG Mobile Bone Density Left forearm                 0.512   -3.0 Left femur neck           0.720   -1.2   10/17/17 DEXA - Lunar Right femur neck         0.781   -1.8 LEft femur neck                       -0.97   06/27/19 3 Phase Bone Scan IMPRESSION: Mildly increased blood flow  and blood pool to periarticular regions of the LEFT knee. Focal increased delayed tracer uptake in the medial and lateral LEFT tibial plateau adjacent to the tibial component of the LEFT knee prosthesis suspicious for aseptic loosening or less likely infection of the prosthesis.   Review of Systems  Constitutional:  Positive for fatigue.  HENT:  Negative for mouth sores and mouth dryness.   Eyes:  Positive for dryness.  Respiratory:  Negative for shortness of breath.   Cardiovascular:  Negative for chest pain and palpitations.  Gastrointestinal:  Positive for constipation and diarrhea. Negative for blood in stool.  Endocrine: Negative for increased urination.  Genitourinary:  Negative for involuntary urination.  Musculoskeletal:  Positive for joint pain, joint pain, myalgias and myalgias. Negative for gait problem,  joint swelling, muscle weakness, morning stiffness and muscle tenderness.  Skin:  Negative for color change, rash, hair loss and sensitivity to sunlight.  Allergic/Immunologic: Negative for susceptible to infections.  Neurological:  Positive for headaches. Negative for dizziness.  Hematological:  Negative for swollen glands.  Psychiatric/Behavioral:  Negative for depressed mood and sleep disturbance. The patient is not nervous/anxious.     PMFS History:  Patient Active Problem List   Diagnosis Date Noted   Bradycardia 04/13/2022   Degeneration of lumbar intervertebral disc 12/14/2020   Diverticulosis of colon 12/14/2020   Essential hypertension 12/14/2020   Gastroesophageal reflux disease 12/14/2020   History of depression 12/14/2020   Kidney stone 12/14/2020   Osteoporosis 12/14/2020   Obesity 12/14/2020   Postsurgical malabsorption, not elsewhere classified 12/14/2020   Primary osteoarthritis, unspecified ankle and foot 12/14/2020   Severe major depression, single episode, without psychotic features 12/14/2020   Sleep disturbance 12/14/2020   Vitamin B12 deficiency (non anemic) 12/14/2020   Pain in left shoulder 12/14/2020   Painful total knee replacement, left 08/20/2019   Primary osteoarthritis of left knee 08/14/2016   Degenerative arthritis of left knee 08/11/2016    Past Medical History:  Diagnosis Date   Arthritis    Depression    Family history of adverse reaction to anesthesia    ? father confusion with anesthesia when older   GERD (gastroesophageal reflux disease)    occ   History of kidney stones    Hypertension    Osteoporosis     Family History  Problem Relation Age of Onset   Aortic stenosis Mother    Arrhythmia Mother    Breast cancer Mother    Osteoarthritis Mother    Heart disease Mother    Aortic stenosis Father    Melanoma Father    Osteoarthritis Father    Breast cancer Sister    Past Surgical History:  Procedure Laterality Date    ADENOIDECTOMY     child   EYE SURGERY     cataract   ROUX-EN-Y GASTRIC BYPASS  2002   SINUS EXPLORATION     THIGH LIFT     outer   TOTAL KNEE ARTHROPLASTY Left 08/14/2016   Procedure: TOTAL KNEE ARTHROPLASTY;  Surgeon: Gean Birchwood, MD;  Location: [facility];  Service: Orthopedics;  Laterality: Left;   TOTAL KNEE REVISION Left 08/25/2019   Procedure: LEFT TOTAL KNEE REVISION;  Surgeon: Gean Birchwood, MD;  Location: [facility];  Service: Orthopedics;  Laterality: Left;   TRIGGER FINGER RELEASE Right 11/01/2017   Procedure: RIGHT THUMB TRIGGER RELEASE;  Surgeon: Betha Loa, MD;  Location: [facility];  Service: Orthopedics;  Laterality: Right;   Social History   Social History Narrative  Not on file   Immunization History  Administered Date(s) Administered   PFIZER(Purple Top)SARS-COV-2 Vaccination 01/24/2019, 02/14/2019, 05/22/2019, 09/18/2019, 04/03/2020   Pfizer Covid-19 Vaccine Bivalent Booster 1364yrs & up 11/03/2020     Objective: Vital Signs: BP 115/75 (BP Location: Left Arm, Patient Position: Sitting, Cuff Size: Normal)   Pulse (!) 47   Ht 5\' 4"  (1.626 m)   Wt 168 lb (76.2 kg)   BMI 28.84 kg/m    Physical Exam Cardiovascular:     Rate and Rhythm: Regular rhythm. Bradycardia present.  Pulmonary:     Effort: Pulmonary effort is normal.     Breath sounds: Normal breath sounds.  Musculoskeletal:     Right lower leg: No edema.     Left lower leg: No edema.  Skin:    Findings: No rash.  Neurological:     Mental Status: She is alert.      Musculoskeletal Exam:  Elbows full ROM no tenderness or swelling Wrists full ROM no tenderness or swelling Fingers full ROM no tenderness or swelling Knees full ROM no tenderness or swelling  Investigation: No additional findings.  Imaging: No results found.  Recent Labs: Lab Results  Component Value Date   WBC 5.2 09/23/2021   HGB 13.1 09/23/2021   PLT 235 09/23/2021   NA 139 09/23/2021   K 5.0 09/23/2021   CL  103 09/23/2021   CO2 31 09/23/2021   GLUCOSE 98 09/23/2021   BUN 25 09/23/2021   CREATININE 0.92 09/23/2021   BILITOT 0.4 09/23/2021   ALKPHOS 71 05/06/2009   AST 20 09/23/2021   ALT 13 09/23/2021   PROT 6.4 09/23/2021   ALBUMIN 2.3 (L) 05/06/2009   CALCIUM 9.6 09/23/2021   GFRAA >60 08/14/2019    Speciality Comments: No specialty comments available.  Procedures:  No procedures performed Allergies: Lisinopril and Ibandronic acid   Assessment / Plan:     Visit Diagnoses: Age-related osteoporosis without current pathological fracture  She is doing well tolerating the Prolia injection so far staying consistent with calcium vitamin D supplementation and regular weightbearing exercise.  Due for next dose plans to come back in the next week or 2 for Prolia injection.  Next DEXA scan be due sometime in October or later for 2-year follow-up.  Chronic left shoulder pain  Known shoulder arthritis working with physical therapy on this again currently.  Not having any associated functional difficulty with daily activities and not requiring any assistive device or other aids with mobility.  Bradycardia  Resting heart rate mildly low today confirmed on manual auscultation.  She does have some occasional lightheadedness and fatigue but no specifically orthostatic symptoms and feels well today.  I recommend she get follow-up with her PCP about this.  She is on Diovan but not on any other cardiac medications that I would strongly suspect for contributing.  Orders: Orders Placed This Encounter  Procedures   DG BONE DENSITY (DXA)   No orders of the defined types were placed in this encounter.    Follow-Up Instructions: Return in about 1 year (around 04/13/2023) for OP on Prolia f/u 1145yr.   Fuller Planhristopher W Layann Bluett, MD  Note - This record has been created using [employer] software.  Chart creation errors have been sought, but may not always  have been located. Such creation errors do not reflect on   the standard of medical care.

## 2022-04-13 ENCOUNTER — Encounter: Payer: Self-pay | Admitting: Internal Medicine

## 2022-04-13 ENCOUNTER — Other Ambulatory Visit: Payer: Self-pay

## 2022-04-13 ENCOUNTER — Ambulatory Visit: Payer: Medicare HMO | Attending: Internal Medicine | Admitting: Internal Medicine

## 2022-04-13 ENCOUNTER — Other Ambulatory Visit (HOSPITAL_COMMUNITY): Payer: Self-pay

## 2022-04-13 VITALS — BP 115/75 | HR 47 | Ht 64.0 in | Wt 168.0 lb

## 2022-04-13 DIAGNOSIS — R001 Bradycardia, unspecified: Secondary | ICD-10-CM

## 2022-04-13 DIAGNOSIS — M25512 Pain in left shoulder: Secondary | ICD-10-CM | POA: Diagnosis not present

## 2022-04-13 DIAGNOSIS — M810 Age-related osteoporosis without current pathological fracture: Secondary | ICD-10-CM

## 2022-04-13 DIAGNOSIS — G8929 Other chronic pain: Secondary | ICD-10-CM | POA: Diagnosis not present

## 2022-04-13 MED ORDER — DENOSUMAB 60 MG/ML ~~LOC~~ SOSY
60.0000 mg | PREFILLED_SYRINGE | SUBCUTANEOUS | 0 refills | Status: DC
  Filled 2022-04-13 – 2022-04-19 (×2): qty 1, 180d supply, fill #0

## 2022-04-13 NOTE — Telephone Encounter (Signed)
Patient had OV today. She had labs completed with PCP on 03/30/22. Calcium was 10.2 (wnl) and Vitamin D was 84 (wnl). She will have PCP refax lab results to [place]  Rx for Prolia sent to [facility] to be couriered to clinic for appt on 04/19/22.  [name], PharmD, MPH, BCPS, CPP Clinical Pharmacist (Rheumatology and Pulmonology)

## 2022-04-17 DIAGNOSIS — M25512 Pain in left shoulder: Secondary | ICD-10-CM | POA: Diagnosis not present

## 2022-04-19 ENCOUNTER — Ambulatory Visit: Payer: Medicare HMO | Admitting: Pharmacist

## 2022-04-19 ENCOUNTER — Other Ambulatory Visit: Payer: Self-pay

## 2022-04-19 ENCOUNTER — Other Ambulatory Visit (HOSPITAL_COMMUNITY): Payer: Self-pay

## 2022-04-19 DIAGNOSIS — M25512 Pain in left shoulder: Secondary | ICD-10-CM | POA: Diagnosis not present

## 2022-04-24 ENCOUNTER — Ambulatory Visit: Payer: Medicare HMO | Attending: Internal Medicine | Admitting: Pharmacist

## 2022-04-24 DIAGNOSIS — Z7689 Persons encountering health services in other specified circumstances: Secondary | ICD-10-CM

## 2022-04-24 DIAGNOSIS — M810 Age-related osteoporosis without current pathological fracture: Secondary | ICD-10-CM

## 2022-04-24 DIAGNOSIS — M25512 Pain in left shoulder: Secondary | ICD-10-CM | POA: Diagnosis not present

## 2022-04-24 MED ORDER — DENOSUMAB 60 MG/ML ~~LOC~~ SOSY
60.0000 mg | PREFILLED_SYRINGE | Freq: Once | SUBCUTANEOUS | Status: AC
  Administered 2022-04-24: 60 mg via SUBCUTANEOUS

## 2022-04-24 NOTE — Progress Notes (Signed)
Pharmacy Note  Subjective:   Patient presents to clinic today to receive bi-annual dose of Prolia. Patient's last dose of Prolia was on 10/11/2021  Patient running a fever or have signs/symptoms of infection? No  Patient currently on antibiotics for the treatment of infection? No  Patient had fall in the last 6 months?  No    Patient taking calcium 1200 mg daily through diet or supplement and at least 800 units vitamin D? Yes  Objective: CMP     Component Value Date/Time   NA 139 09/23/2021 1340   K 5.0 09/23/2021 1340   CL 103 09/23/2021 1340   CO2 31 09/23/2021 1340   GLUCOSE 98 09/23/2021 1340   BUN 25 09/23/2021 1340   CREATININE 0.92 09/23/2021 1340   CALCIUM 9.6 09/23/2021 1340   PROT 6.4 09/23/2021 1340   ALBUMIN 2.3 (L) 05/06/2009 0600   AST 20 09/23/2021 1340   ALT 13 09/23/2021 1340   ALKPHOS 71 05/06/2009 0600   BILITOT 0.4 09/23/2021 1340   GFRNONAA 55 (L) 08/14/2019 0940   GFRAA >60 08/14/2019 0940    CBC    Component Value Date/Time   WBC 5.2 09/23/2021 1340   RBC 4.33 09/23/2021 1340   HGB 13.1 09/23/2021 1340   HCT 38.8 09/23/2021 1340   PLT 235 09/23/2021 1340   MCV 89.6 09/23/2021 1340   MCH 30.3 09/23/2021 1340   MCHC 33.8 09/23/2021 1340   RDW 12.6 09/23/2021 1340   LYMPHSABS 1,331 09/23/2021 1340   MONOABS 0.6 08/14/2019 0940   EOSABS 109 09/23/2021 1340   BASOSABS 31 09/23/2021 1340    No results found for: "VD25OH"  10/07/20 DG Mobile Bone Density Left forearm                 0.512   -3.0 Left femur neck           0.720   -1.2   10/17/17 DEXA - Lunar Right femur neck         0.781   -1.8 LEft femur neck                       -0.97  Assessment/Plan:   Reviewed importance of adequate dietary intake of calcium in addition to supplementation due to risk of hypocalcemia with Prolia.   Patient tolerated injection well.   Administrations This Visit     denosumab (PROLIA) injection 60 mg     Admin Date 04/24/2022 Action Given  Dose 60 mg Route Subcutaneous Administered By Murrell Redden, RPH-CPP           Patient's next Prolia dose is due on 10/21/22.  Patient is due for updated DEXA in October 2024. Already ordered by Dr. Dimple Casey   All questions encouraged and answered.  Instructed patient to call with any further questions or concerns.  [name], PharmD, MPH, BCPS, CPP Clinical Pharmacist (Rheumatology and Pulmonology)

## 2022-04-26 DIAGNOSIS — M25512 Pain in left shoulder: Secondary | ICD-10-CM | POA: Diagnosis not present

## 2022-05-03 DIAGNOSIS — M25512 Pain in left shoulder: Secondary | ICD-10-CM | POA: Diagnosis not present

## 2022-05-09 ENCOUNTER — Other Ambulatory Visit: Payer: Self-pay | Admitting: Sports Medicine

## 2022-05-09 DIAGNOSIS — I10 Essential (primary) hypertension: Secondary | ICD-10-CM | POA: Diagnosis not present

## 2022-05-09 DIAGNOSIS — M545 Low back pain, unspecified: Secondary | ICD-10-CM | POA: Diagnosis not present

## 2022-05-09 DIAGNOSIS — J302 Other seasonal allergic rhinitis: Secondary | ICD-10-CM | POA: Diagnosis not present

## 2022-05-09 DIAGNOSIS — M25512 Pain in left shoulder: Secondary | ICD-10-CM

## 2022-05-09 DIAGNOSIS — G2581 Restless legs syndrome: Secondary | ICD-10-CM | POA: Diagnosis not present

## 2022-05-09 DIAGNOSIS — Z87891 Personal history of nicotine dependence: Secondary | ICD-10-CM | POA: Diagnosis not present

## 2022-05-09 DIAGNOSIS — K219 Gastro-esophageal reflux disease without esophagitis: Secondary | ICD-10-CM | POA: Diagnosis not present

## 2022-05-09 DIAGNOSIS — M1990 Unspecified osteoarthritis, unspecified site: Secondary | ICD-10-CM | POA: Diagnosis not present

## 2022-05-09 DIAGNOSIS — Z8249 Family history of ischemic heart disease and other diseases of the circulatory system: Secondary | ICD-10-CM | POA: Diagnosis not present

## 2022-05-09 DIAGNOSIS — I499 Cardiac arrhythmia, unspecified: Secondary | ICD-10-CM | POA: Diagnosis not present

## 2022-05-09 DIAGNOSIS — R32 Unspecified urinary incontinence: Secondary | ICD-10-CM | POA: Diagnosis not present

## 2022-05-09 DIAGNOSIS — G4700 Insomnia, unspecified: Secondary | ICD-10-CM | POA: Diagnosis not present

## 2022-05-09 DIAGNOSIS — F325 Major depressive disorder, single episode, in full remission: Secondary | ICD-10-CM | POA: Diagnosis not present

## 2022-05-19 ENCOUNTER — Encounter: Payer: Self-pay | Admitting: Sports Medicine

## 2022-05-23 ENCOUNTER — Ambulatory Visit: Payer: Medicare HMO | Admitting: Internal Medicine

## 2022-05-28 ENCOUNTER — Ambulatory Visit
Admission: RE | Admit: 2022-05-28 | Discharge: 2022-05-28 | Disposition: A | Discharge status: Home / Self Care | Payer: Medicare HMO | Source: Ambulatory Visit | Attending: Sports Medicine | Admitting: Sports Medicine

## 2022-05-28 DIAGNOSIS — M25512 Pain in left shoulder: Secondary | ICD-10-CM

## 2022-05-28 DIAGNOSIS — M19012 Primary osteoarthritis, left shoulder: Secondary | ICD-10-CM | POA: Diagnosis not present

## 2022-06-20 ENCOUNTER — Other Ambulatory Visit: Payer: Self-pay | Admitting: Orthopaedic Surgery

## 2022-06-20 DIAGNOSIS — Z01818 Encounter for other preprocedural examination: Secondary | ICD-10-CM

## 2022-06-20 DIAGNOSIS — M25512 Pain in left shoulder: Secondary | ICD-10-CM | POA: Diagnosis not present

## 2022-06-22 ENCOUNTER — Ambulatory Visit
Admission: RE | Admit: 2022-06-22 | Discharge: 2022-06-22 | Disposition: A | Discharge status: Home / Self Care | Payer: Medicare HMO | Source: Ambulatory Visit | Attending: Orthopaedic Surgery | Admitting: Orthopaedic Surgery

## 2022-06-22 DIAGNOSIS — Z01818 Encounter for other preprocedural examination: Secondary | ICD-10-CM

## 2022-06-22 DIAGNOSIS — Z01812 Encounter for preprocedural laboratory examination: Secondary | ICD-10-CM | POA: Diagnosis not present

## 2022-06-22 DIAGNOSIS — M25512 Pain in left shoulder: Secondary | ICD-10-CM | POA: Diagnosis not present

## 2022-06-22 DIAGNOSIS — M19012 Primary osteoarthritis, left shoulder: Secondary | ICD-10-CM | POA: Diagnosis not present

## 2022-06-23 ENCOUNTER — Other Ambulatory Visit: Payer: Medicare HMO

## 2022-08-03 HISTORY — PX: SHOULDER SURGERY: SHX246

## 2022-08-03 HISTORY — PX: TOTAL SHOULDER REPLACEMENT: SUR1217

## 2022-08-07 DIAGNOSIS — M19012 Primary osteoarthritis, left shoulder: Secondary | ICD-10-CM | POA: Diagnosis not present

## 2022-08-07 DIAGNOSIS — M6281 Muscle weakness (generalized): Secondary | ICD-10-CM | POA: Diagnosis not present

## 2022-08-07 DIAGNOSIS — M25612 Stiffness of left shoulder, not elsewhere classified: Secondary | ICD-10-CM | POA: Diagnosis not present

## 2022-08-09 DIAGNOSIS — M19012 Primary osteoarthritis, left shoulder: Secondary | ICD-10-CM | POA: Diagnosis not present

## 2022-08-09 DIAGNOSIS — G8918 Other acute postprocedural pain: Secondary | ICD-10-CM | POA: Diagnosis not present

## 2022-08-09 DIAGNOSIS — M25712 Osteophyte, left shoulder: Secondary | ICD-10-CM | POA: Diagnosis not present

## 2022-08-09 DIAGNOSIS — M154 Erosive (osteo)arthritis: Secondary | ICD-10-CM | POA: Diagnosis not present

## 2022-08-11 DIAGNOSIS — M19012 Primary osteoarthritis, left shoulder: Secondary | ICD-10-CM | POA: Diagnosis not present

## 2022-08-11 DIAGNOSIS — M6281 Muscle weakness (generalized): Secondary | ICD-10-CM | POA: Diagnosis not present

## 2022-08-11 DIAGNOSIS — M25612 Stiffness of left shoulder, not elsewhere classified: Secondary | ICD-10-CM | POA: Diagnosis not present

## 2022-08-14 DIAGNOSIS — M19012 Primary osteoarthritis, left shoulder: Secondary | ICD-10-CM | POA: Diagnosis not present

## 2022-08-14 DIAGNOSIS — M25612 Stiffness of left shoulder, not elsewhere classified: Secondary | ICD-10-CM | POA: Diagnosis not present

## 2022-08-14 DIAGNOSIS — M6281 Muscle weakness (generalized): Secondary | ICD-10-CM | POA: Diagnosis not present

## 2022-08-16 DIAGNOSIS — M25612 Stiffness of left shoulder, not elsewhere classified: Secondary | ICD-10-CM | POA: Diagnosis not present

## 2022-08-16 DIAGNOSIS — M6281 Muscle weakness (generalized): Secondary | ICD-10-CM | POA: Diagnosis not present

## 2022-08-16 DIAGNOSIS — M19012 Primary osteoarthritis, left shoulder: Secondary | ICD-10-CM | POA: Diagnosis not present

## 2022-08-17 DIAGNOSIS — M19012 Primary osteoarthritis, left shoulder: Secondary | ICD-10-CM | POA: Diagnosis not present

## 2022-08-21 DIAGNOSIS — M19012 Primary osteoarthritis, left shoulder: Secondary | ICD-10-CM | POA: Diagnosis not present

## 2022-08-21 DIAGNOSIS — M6281 Muscle weakness (generalized): Secondary | ICD-10-CM | POA: Diagnosis not present

## 2022-08-21 DIAGNOSIS — M25612 Stiffness of left shoulder, not elsewhere classified: Secondary | ICD-10-CM | POA: Diagnosis not present

## 2022-08-30 DIAGNOSIS — M25612 Stiffness of left shoulder, not elsewhere classified: Secondary | ICD-10-CM | POA: Diagnosis not present

## 2022-08-30 DIAGNOSIS — M19012 Primary osteoarthritis, left shoulder: Secondary | ICD-10-CM | POA: Diagnosis not present

## 2022-08-30 DIAGNOSIS — M6281 Muscle weakness (generalized): Secondary | ICD-10-CM | POA: Diagnosis not present

## 2022-09-07 DIAGNOSIS — M6281 Muscle weakness (generalized): Secondary | ICD-10-CM | POA: Diagnosis not present

## 2022-09-07 DIAGNOSIS — M19012 Primary osteoarthritis, left shoulder: Secondary | ICD-10-CM | POA: Diagnosis not present

## 2022-09-07 DIAGNOSIS — M25612 Stiffness of left shoulder, not elsewhere classified: Secondary | ICD-10-CM | POA: Diagnosis not present

## 2022-09-12 DIAGNOSIS — M6281 Muscle weakness (generalized): Secondary | ICD-10-CM | POA: Diagnosis not present

## 2022-09-12 DIAGNOSIS — M19012 Primary osteoarthritis, left shoulder: Secondary | ICD-10-CM | POA: Diagnosis not present

## 2022-09-12 DIAGNOSIS — M25612 Stiffness of left shoulder, not elsewhere classified: Secondary | ICD-10-CM | POA: Diagnosis not present

## 2022-09-19 DIAGNOSIS — L570 Actinic keratosis: Secondary | ICD-10-CM | POA: Diagnosis not present

## 2022-09-19 DIAGNOSIS — L814 Other melanin hyperpigmentation: Secondary | ICD-10-CM | POA: Diagnosis not present

## 2022-09-19 DIAGNOSIS — D485 Neoplasm of uncertain behavior of skin: Secondary | ICD-10-CM | POA: Diagnosis not present

## 2022-09-19 DIAGNOSIS — D692 Other nonthrombocytopenic purpura: Secondary | ICD-10-CM | POA: Diagnosis not present

## 2022-09-19 DIAGNOSIS — M19012 Primary osteoarthritis, left shoulder: Secondary | ICD-10-CM | POA: Diagnosis not present

## 2022-09-19 DIAGNOSIS — L821 Other seborrheic keratosis: Secondary | ICD-10-CM | POA: Diagnosis not present

## 2022-09-20 DIAGNOSIS — M25612 Stiffness of left shoulder, not elsewhere classified: Secondary | ICD-10-CM | POA: Diagnosis not present

## 2022-09-20 DIAGNOSIS — M6281 Muscle weakness (generalized): Secondary | ICD-10-CM | POA: Diagnosis not present

## 2022-09-20 DIAGNOSIS — M19012 Primary osteoarthritis, left shoulder: Secondary | ICD-10-CM | POA: Diagnosis not present

## 2022-09-27 DIAGNOSIS — M19012 Primary osteoarthritis, left shoulder: Secondary | ICD-10-CM | POA: Diagnosis not present

## 2022-09-27 DIAGNOSIS — M25612 Stiffness of left shoulder, not elsewhere classified: Secondary | ICD-10-CM | POA: Diagnosis not present

## 2022-09-27 DIAGNOSIS — M6281 Muscle weakness (generalized): Secondary | ICD-10-CM | POA: Diagnosis not present

## 2022-10-04 DIAGNOSIS — M25612 Stiffness of left shoulder, not elsewhere classified: Secondary | ICD-10-CM | POA: Diagnosis not present

## 2022-10-04 DIAGNOSIS — M19012 Primary osteoarthritis, left shoulder: Secondary | ICD-10-CM | POA: Diagnosis not present

## 2022-10-04 DIAGNOSIS — M6281 Muscle weakness (generalized): Secondary | ICD-10-CM | POA: Diagnosis not present

## 2022-10-05 DIAGNOSIS — Z9181 History of falling: Secondary | ICD-10-CM | POA: Diagnosis not present

## 2022-10-05 DIAGNOSIS — K219 Gastro-esophageal reflux disease without esophagitis: Secondary | ICD-10-CM | POA: Diagnosis not present

## 2022-10-05 DIAGNOSIS — Z0000 Encounter for general adult medical examination without abnormal findings: Secondary | ICD-10-CM | POA: Diagnosis not present

## 2022-10-05 DIAGNOSIS — Z1211 Encounter for screening for malignant neoplasm of colon: Secondary | ICD-10-CM | POA: Diagnosis not present

## 2022-10-05 DIAGNOSIS — M810 Age-related osteoporosis without current pathological fracture: Secondary | ICD-10-CM | POA: Diagnosis not present

## 2022-10-05 DIAGNOSIS — Z1231 Encounter for screening mammogram for malignant neoplasm of breast: Secondary | ICD-10-CM | POA: Diagnosis not present

## 2022-10-05 DIAGNOSIS — F5101 Primary insomnia: Secondary | ICD-10-CM | POA: Diagnosis not present

## 2022-10-05 DIAGNOSIS — I10 Essential (primary) hypertension: Secondary | ICD-10-CM | POA: Diagnosis not present

## 2022-10-05 DIAGNOSIS — F331 Major depressive disorder, recurrent, moderate: Secondary | ICD-10-CM | POA: Diagnosis not present

## 2022-10-05 DIAGNOSIS — G4762 Sleep related leg cramps: Secondary | ICD-10-CM | POA: Diagnosis not present

## 2022-10-09 ENCOUNTER — Telehealth: Payer: Self-pay | Admitting: Pharmacist

## 2022-10-09 ENCOUNTER — Other Ambulatory Visit: Payer: Self-pay | Admitting: *Deleted

## 2022-10-09 ENCOUNTER — Other Ambulatory Visit (HOSPITAL_COMMUNITY): Payer: Self-pay

## 2022-10-09 DIAGNOSIS — M810 Age-related osteoporosis without current pathological fracture: Secondary | ICD-10-CM

## 2022-10-09 DIAGNOSIS — Z1211 Encounter for screening for malignant neoplasm of colon: Secondary | ICD-10-CM | POA: Diagnosis not present

## 2022-10-09 DIAGNOSIS — Z79899 Other long term (current) drug therapy: Secondary | ICD-10-CM

## 2022-10-09 DIAGNOSIS — E559 Vitamin D deficiency, unspecified: Secondary | ICD-10-CM | POA: Diagnosis not present

## 2022-10-09 NOTE — Telephone Encounter (Signed)
Patient due for Prolia on 10/21/2022. Unable to run test claim - RTS until 10/16/2022  Spoke with patient regarding labwork. Orders in place for CBC, CMP, Vitamin D. She will stop by for bloodwork this week  [name], PharmD, MPH, BCPS, CPP Clinical Pharmacist (Rheumatology and Pulmonology)

## 2022-10-10 LAB — CBC WITH DIFFERENTIAL/PLATELET
Absolute Monocytes: 472 {cells}/uL (ref 200–950)
Basophils Absolute: 58 {cells}/uL (ref 0–200)
Basophils Relative: 1.1 %
Eosinophils Absolute: 170 {cells}/uL (ref 15–500)
Eosinophils Relative: 3.2 %
HCT: 36 % (ref 35.0–45.0)
Hemoglobin: 11.5 g/dL — ABNORMAL LOW (ref 11.7–15.5)
Lymphs Abs: 1691 {cells}/uL (ref 850–3900)
MCH: 29.6 pg (ref 27.0–33.0)
MCHC: 31.9 g/dL — ABNORMAL LOW (ref 32.0–36.0)
MCV: 92.5 fL (ref 80.0–100.0)
MPV: 9 fL (ref 7.5–12.5)
Monocytes Relative: 8.9 %
Neutro Abs: 2910 {cells}/uL (ref 1500–7800)
Neutrophils Relative %: 54.9 %
Platelets: 237 10*3/uL (ref 140–400)
RBC: 3.89 10*6/uL (ref 3.80–5.10)
RDW: 12.2 % (ref 11.0–15.0)
Total Lymphocyte: 31.9 %
WBC: 5.3 10*3/uL (ref 3.8–10.8)

## 2022-10-10 LAB — COMPREHENSIVE METABOLIC PANEL
AG Ratio: 1.8 (calc) (ref 1.0–2.5)
ALT: 10 U/L (ref 6–29)
AST: 19 U/L (ref 10–35)
Albumin: 3.9 g/dL (ref 3.6–5.1)
Alkaline phosphatase (APISO): 71 U/L (ref 37–153)
BUN: 20 mg/dL (ref 7–25)
CO2: 29 mmol/L (ref 20–32)
Calcium: 9.3 mg/dL (ref 8.6–10.4)
Chloride: 107 mmol/L (ref 98–110)
Creat: 0.87 mg/dL (ref 0.60–1.00)
Globulin: 2.2 g/dL (ref 1.9–3.7)
Glucose, Bld: 85 mg/dL (ref 65–99)
Potassium: 4.6 mmol/L (ref 3.5–5.3)
Sodium: 143 mmol/L (ref 135–146)
Total Bilirubin: 0.4 mg/dL (ref 0.2–1.2)
Total Protein: 6.1 g/dL (ref 6.1–8.1)

## 2022-10-10 LAB — VITAMIN D 25 HYDROXY (VIT D DEFICIENCY, FRACTURES): Vit D, 25-Hydroxy: 59 ng/mL (ref 30–100)

## 2022-10-11 DIAGNOSIS — M6281 Muscle weakness (generalized): Secondary | ICD-10-CM | POA: Diagnosis not present

## 2022-10-11 DIAGNOSIS — M25612 Stiffness of left shoulder, not elsewhere classified: Secondary | ICD-10-CM | POA: Diagnosis not present

## 2022-10-11 DIAGNOSIS — M19012 Primary osteoarthritis, left shoulder: Secondary | ICD-10-CM | POA: Diagnosis not present

## 2022-10-11 NOTE — Telephone Encounter (Signed)
CBC, CMP, and Vitamin D wnl on 10/09/2022. Will need to run test claim on 10/16/2022 to determine Prolia copay through pharmacy benefits  [name], PharmD, MPH, BCPS, CPP Clinical Pharmacist (Rheumatology and Pulmonology)

## 2022-10-16 ENCOUNTER — Telehealth: Payer: Self-pay | Admitting: Internal Medicine

## 2022-10-16 ENCOUNTER — Other Ambulatory Visit: Payer: Self-pay

## 2022-10-16 ENCOUNTER — Other Ambulatory Visit (HOSPITAL_COMMUNITY): Payer: Self-pay

## 2022-10-16 MED ORDER — DENOSUMAB 60 MG/ML ~~LOC~~ SOSY
60.0000 mg | PREFILLED_SYRINGE | SUBCUTANEOUS | 0 refills | Status: DC
  Filled 2022-10-16: qty 1, 180d supply, fill #0

## 2022-10-16 NOTE — Telephone Encounter (Signed)
Pt called asking about her Prolia shot. Pt is out of town nov. 6th for ten days and wanted to see when she should come in.

## 2022-10-16 NOTE — Telephone Encounter (Signed)
Copay for Prolia per test claim is $100. Patient aware of and okay with paying this.  Rx sent to [facility] today to be couriered to clinic  [name], PharmD, MPH, BCPS, CPP Clinical Pharmacist (Rheumatology and Pulmonology)

## 2022-10-16 NOTE — Progress Notes (Signed)
Specialty Pharmacy Refill Coordination Note  Debbe Faraci is a 72 y.o. female assessed today regarding refills of clinic administered specialty medication(s) Denosumab   Clinic requested Courier to Provider Office   Delivery date: 10/19/22   Verified address: [address], [place], [address]   Medication will be filled on 10/18/22.

## 2022-10-16 NOTE — Telephone Encounter (Signed)
Copay for Prolia per test claim is $100. Patient aware of and okay with paying this.  Rx sent to [facility] today to be couriered to clinic  [name], PharmD, MPH, BCPS, CPP Clinical Pharmacist (Rheumatology and Pulmonology)

## 2022-10-18 ENCOUNTER — Other Ambulatory Visit: Payer: Self-pay

## 2022-10-18 DIAGNOSIS — M25612 Stiffness of left shoulder, not elsewhere classified: Secondary | ICD-10-CM | POA: Diagnosis not present

## 2022-10-18 DIAGNOSIS — M19012 Primary osteoarthritis, left shoulder: Secondary | ICD-10-CM | POA: Diagnosis not present

## 2022-10-18 DIAGNOSIS — M6281 Muscle weakness (generalized): Secondary | ICD-10-CM | POA: Diagnosis not present

## 2022-10-19 NOTE — Telephone Encounter (Signed)
Prolia received from pharmacy. Placed in fridge

## 2022-10-23 ENCOUNTER — Ambulatory Visit: Payer: Medicare HMO | Attending: Internal Medicine | Admitting: Pharmacist

## 2022-10-23 DIAGNOSIS — Z7689 Persons encountering health services in other specified circumstances: Secondary | ICD-10-CM

## 2022-10-23 DIAGNOSIS — M810 Age-related osteoporosis without current pathological fracture: Secondary | ICD-10-CM

## 2022-10-23 MED ORDER — DENOSUMAB 60 MG/ML ~~LOC~~ SOSY
60.0000 mg | PREFILLED_SYRINGE | Freq: Once | SUBCUTANEOUS | Status: AC
  Administered 2022-10-23: 60 mg via SUBCUTANEOUS

## 2022-10-23 NOTE — Progress Notes (Signed)
Pharmacy Note  Subjective:   Patient presents to clinic today to receive bi-annual dose of Prolia. Patient's last dose of Prolia was on 04/24/22  She has left shoulder replacement surgery in August 2024 and still seeing physical therapy  Patient running a fever or have signs/symptoms of infection? No  Patient currently on antibiotics for the treatment of infection? No  Patient had fall in the last 6 months?  No   Patient taking calcium 1200 mg daily through diet or supplement and at least 800 units vitamin D? Yes  Objective: CMP     Component Value Date/Time   NA 143 10/09/2022 1423   K 4.6 10/09/2022 1423   CL 107 10/09/2022 1423   CO2 29 10/09/2022 1423   GLUCOSE 85 10/09/2022 1423   BUN 20 10/09/2022 1423   CREATININE 0.87 10/09/2022 1423   CALCIUM 9.3 10/09/2022 1423   PROT 6.1 10/09/2022 1423   ALBUMIN 2.3 (L) 05/06/2009 0600   AST 19 10/09/2022 1423   ALT 10 10/09/2022 1423   ALKPHOS 71 05/06/2009 0600   BILITOT 0.4 10/09/2022 1423   GFRNONAA 55 (L) 08/14/2019 0940   GFRAA >60 08/14/2019 0940    CBC    Component Value Date/Time   WBC 5.3 10/09/2022 1423   RBC 3.89 10/09/2022 1423   HGB 11.5 (L) 10/09/2022 1423   HCT 36.0 10/09/2022 1423   PLT 237 10/09/2022 1423   MCV 92.5 10/09/2022 1423   MCH 29.6 10/09/2022 1423   MCHC 31.9 (L) 10/09/2022 1423   RDW 12.2 10/09/2022 1423   LYMPHSABS 1,691 10/09/2022 1423   MONOABS 0.6 08/14/2019 0940   EOSABS 170 10/09/2022 1423   BASOSABS 58 10/09/2022 1423    Lab Results  Component Value Date   VD25OH 59 10/09/2022   10/07/20 DG Mobile Bone Density Left forearm                 0.512   -3.0 Left femur neck           0.720   -1.2   10/17/17 DEXA - Lunar Right femur neck         0.781   -1.8 Left femur neck                       -0.97  Assessment/Plan:   Reviewed importance of adequate dietary intake of calcium in addition to supplementation due to risk of hypocalcemia with Prolia.   Patient tolerated  injection well.   Administration details as below: Administrations This Visit     denosumab (PROLIA) injection 60 mg     Admin Date 10/23/2022 Action Given Dose 60 mg Route Subcutaneous Documented By Murrell Redden, RPH-CPP           Patient's next Prolia dose is due on 04/21/2023.  Patient is scheduled for updated DEXA on 11/23/22.   All questions encouraged and answered.  Instructed patient to call with any further questions or concerns.

## 2022-10-25 DIAGNOSIS — M19012 Primary osteoarthritis, left shoulder: Secondary | ICD-10-CM | POA: Diagnosis not present

## 2022-10-25 DIAGNOSIS — M6281 Muscle weakness (generalized): Secondary | ICD-10-CM | POA: Diagnosis not present

## 2022-10-25 DIAGNOSIS — M25612 Stiffness of left shoulder, not elsewhere classified: Secondary | ICD-10-CM | POA: Diagnosis not present

## 2022-11-01 DIAGNOSIS — M19012 Primary osteoarthritis, left shoulder: Secondary | ICD-10-CM | POA: Diagnosis not present

## 2022-11-01 DIAGNOSIS — M6281 Muscle weakness (generalized): Secondary | ICD-10-CM | POA: Diagnosis not present

## 2022-11-01 DIAGNOSIS — M25612 Stiffness of left shoulder, not elsewhere classified: Secondary | ICD-10-CM | POA: Diagnosis not present

## 2022-11-08 DIAGNOSIS — M6281 Muscle weakness (generalized): Secondary | ICD-10-CM | POA: Diagnosis not present

## 2022-11-08 DIAGNOSIS — M25612 Stiffness of left shoulder, not elsewhere classified: Secondary | ICD-10-CM | POA: Diagnosis not present

## 2022-11-08 DIAGNOSIS — M19012 Primary osteoarthritis, left shoulder: Secondary | ICD-10-CM | POA: Diagnosis not present

## 2022-11-09 ENCOUNTER — Other Ambulatory Visit: Payer: Self-pay

## 2022-11-22 DIAGNOSIS — M25612 Stiffness of left shoulder, not elsewhere classified: Secondary | ICD-10-CM | POA: Diagnosis not present

## 2022-11-22 DIAGNOSIS — M19012 Primary osteoarthritis, left shoulder: Secondary | ICD-10-CM | POA: Diagnosis not present

## 2022-11-22 DIAGNOSIS — M6281 Muscle weakness (generalized): Secondary | ICD-10-CM | POA: Diagnosis not present

## 2022-11-23 ENCOUNTER — Ambulatory Visit
Admission: RE | Admit: 2022-11-23 | Discharge: 2022-11-23 | Disposition: A | Discharge status: Home / Self Care | Payer: Medicare HMO | Source: Ambulatory Visit | Attending: Internal Medicine | Admitting: Internal Medicine

## 2022-11-23 DIAGNOSIS — N958 Other specified menopausal and perimenopausal disorders: Secondary | ICD-10-CM | POA: Diagnosis not present

## 2022-11-23 DIAGNOSIS — M810 Age-related osteoporosis without current pathological fracture: Secondary | ICD-10-CM

## 2022-11-23 DIAGNOSIS — M8588 Other specified disorders of bone density and structure, other site: Secondary | ICD-10-CM | POA: Diagnosis not present

## 2022-11-29 DIAGNOSIS — M6281 Muscle weakness (generalized): Secondary | ICD-10-CM | POA: Diagnosis not present

## 2022-11-29 DIAGNOSIS — M19012 Primary osteoarthritis, left shoulder: Secondary | ICD-10-CM | POA: Diagnosis not present

## 2022-11-29 DIAGNOSIS — M25612 Stiffness of left shoulder, not elsewhere classified: Secondary | ICD-10-CM | POA: Diagnosis not present

## 2022-12-06 DIAGNOSIS — M19012 Primary osteoarthritis, left shoulder: Secondary | ICD-10-CM | POA: Diagnosis not present

## 2022-12-06 DIAGNOSIS — M6281 Muscle weakness (generalized): Secondary | ICD-10-CM | POA: Diagnosis not present

## 2022-12-06 DIAGNOSIS — M25612 Stiffness of left shoulder, not elsewhere classified: Secondary | ICD-10-CM | POA: Diagnosis not present

## 2023-02-13 ENCOUNTER — Other Ambulatory Visit: Payer: Self-pay

## 2023-03-13 ENCOUNTER — Other Ambulatory Visit: Payer: Self-pay | Admitting: Family Medicine

## 2023-03-13 DIAGNOSIS — H532 Diplopia: Secondary | ICD-10-CM

## 2023-03-19 ENCOUNTER — Encounter: Payer: Self-pay | Admitting: Neurology

## 2023-03-26 ENCOUNTER — Ambulatory Visit
Admission: RE | Admit: 2023-03-26 | Discharge: 2023-03-26 | Disposition: A | Discharge status: Home / Self Care | Source: Ambulatory Visit | Attending: Family Medicine | Admitting: Family Medicine

## 2023-03-26 DIAGNOSIS — H532 Diplopia: Secondary | ICD-10-CM

## 2023-03-30 NOTE — Progress Notes (Signed)
 Office Visit Note  Patient: Laura Bean             Date of Birth: [birth year]           MRN: [id number]             PCP: Blair Heys, MD (Inactive) Referring: Blair Heys, MD Visit Date: 04/13/2023   Subjective:  Follow-up   Discussed the use of AI scribe software for clinical note transcription with the patient, who gave verbal consent to proceed.  History of Present Illness   Laura Hernan "Debbe" is a 73 year old female with osteoporosis who presents for follow-up on Prolia treatment and bone density results.  She has been on Prolia, and the recent bone density scan shows improvement in her left forearm with a T score increasing from -3.0 in 2022 to -2.6. The T score in the femur neck remains unchanged at -1.8, indicating osteopenia.  She has not experienced any significant issues with the Prolia injections, with the last dose administered in October. No fragility-related falls have occurred, although she did fall about five weeks ago while navigating a steep hill, resulting in scrapes on her face but no significant injuries or bruising.  Her past medical history includes degenerative disc disease, which prevents accurate bone density measurement in her spine. She had labs done on February 27, with normal metabolic panel and electrolytes.  Previous HPI 04/13/2022 Laura Bean is a 73 y.o. female here for follow up for osteoporosis on prolia started since last April due for next treatment at this time. No intolerance or other problems with the first 2 injections. She is working with physical therapy for shoulder pain not much improvement yet this year but previously had good benefit from this. Stays active walking her dogs multiple times daily.  She had recent lab work checked with PCP office about 2 weeks ago that was fine for her Prolia.   Previous HPI 05/24/21 Laura Bean is a 73 y.o. female here for follow up for osteoporosis.  She started Prolia treatment first  dose 4/11.  She had follow-up labs checked later in the month with a normal complete metabolic panel.  She did not experience any injection reaction did not have any bone pain or noticeable symptoms after starting the medication.  She did sustain 1 fall in March tripping on a rug in her home she had some body aches immediately afterwards no serious injury or requiring medical attention.  She is taking calcium and vitamin D daily supplements.  She remains active mostly walking and taking care for her dogs.   Previous HPI 12/14/2020 Laura Bean is a 73 y.o. female here for osteoporosis. She has a medical history of nephrolithiasis, bariatric surgery, scoliosis, osteoarthritis with left knee replacement, and degenerative disc disease of lumbar spine. She was diagnosed with osteoporosis based on forearm bone density of -3.0 on DEXA in October with stable osteopenia on hip BMD in 2017, 2019, and 2022 scans reviewed. She started ibandronate for this but did not tolerate this due to developing body pains in multiple areas within a day mostly improved after 36 hours but some ongoing increased pain until now.  She feels mostly back to baseline thinks areas where she had existing pain from osteoarthritis symptoms remained more active since this occurred. She takes vitamin D supplementation regular has not had any low levels for longer than she can recall.  She generally does not have much GI problem or diarrhea or malabsorption with her history of  bariatric surgery unless she eats too quickly.  She is a frequent walker taking care of 2 dogs.  Her mother has history of 2 hip fractures and one vertebral fracture.  Her worst current joint pain at the left shoulder bothering her her activity has been increased lately helping caregiving for her mother.   Labs reviewed 09/2020 CBC unremarkable CMP unremarkable Vit D 79.8   Imaging reviewed 10/07/20 DG Mobile Bone Density Left forearm                 0.512    -3.0 Left femur neck           0.720   -1.2   10/17/17 DEXA - Lunar Right femur neck         0.781   -1.8 LEft femur neck                       -0.97   06/27/19 3 Phase Bone Scan IMPRESSION: Mildly increased blood flow and blood pool to periarticular regions of the LEFT knee. Focal increased delayed tracer uptake in the medial and lateral LEFT tibial plateau adjacent to the tibial component of the LEFT knee prosthesis suspicious for aseptic loosening or less likely infection of the prosthesis.   Review of Systems  Constitutional:  Negative for fatigue.  HENT:  Positive for mouth dryness. Negative for mouth sores.   Eyes:  Positive for dryness.  Respiratory:  Negative for shortness of breath.   Cardiovascular:  Positive for palpitations. Negative for chest pain.  Gastrointestinal:  Positive for constipation and diarrhea. Negative for blood in stool.  Endocrine: Negative for increased urination.  Genitourinary:  Positive for involuntary urination.  Musculoskeletal:  Positive for joint pain, gait problem, joint pain, myalgias and myalgias. Negative for joint swelling, muscle weakness, morning stiffness and muscle tenderness.  Skin:  Positive for sensitivity to sunlight. Negative for color change, rash and hair loss.  Allergic/Immunologic: Negative for susceptible to infections.  Neurological:  Positive for light-headedness and headaches. Negative for dizziness.  Hematological:  Negative for swollen glands.  Psychiatric/Behavioral:  Negative for depressed mood and sleep disturbance. The patient is not nervous/anxious.     PMFS History:  Patient Active Problem List   Diagnosis Date Noted   Bradycardia 04/13/2022   Degeneration of lumbar intervertebral disc 12/14/2020   Diverticulosis of colon 12/14/2020   Essential hypertension 12/14/2020   Gastroesophageal reflux disease 12/14/2020   History of depression 12/14/2020   Kidney stone 12/14/2020   Osteoporosis 12/14/2020   Obesity  12/14/2020   Postsurgical malabsorption, not elsewhere classified 12/14/2020   Primary osteoarthritis, unspecified ankle and foot 12/14/2020   Severe major depression, single episode, without psychotic features (HCC) 12/14/2020   Sleep disturbance 12/14/2020   Vitamin B12 deficiency (non anemic) 12/14/2020   Pain in left shoulder 12/14/2020   Painful total knee replacement, left (HCC) 08/20/2019   Primary osteoarthritis of left knee 08/14/2016   Degenerative arthritis of left knee 08/11/2016    Past Medical History:  Diagnosis Date   Arthritis    Depression    Family history of adverse reaction to anesthesia    ? father confusion with anesthesia when older   GERD (gastroesophageal reflux disease)    occ   History of kidney stones    Hypertension    Osteoporosis     Family History  Problem Relation Age of Onset   Aortic stenosis Mother    Arrhythmia Mother    Breast cancer Mother  Osteoarthritis Mother    Heart disease Mother    Aortic stenosis Father    Melanoma Father    Osteoarthritis Father    Breast cancer Sister    Past Surgical History:  Procedure Laterality Date   ADENOIDECTOMY     child   EYE SURGERY     cataract   ROUX-EN-Y GASTRIC BYPASS  2002   SINUS EXPLORATION     THIGH LIFT     outer   TOTAL KNEE ARTHROPLASTY Left 08/14/2016   Procedure: TOTAL KNEE ARTHROPLASTY;  Surgeon: Gean Birchwood, MD;  Location: [facility];  Service: Orthopedics;  Laterality: Left;   TOTAL KNEE REVISION Left 08/25/2019   Procedure: LEFT TOTAL KNEE REVISION;  Surgeon: Gean Birchwood, MD;  Location: [facility];  Service: Orthopedics;  Laterality: Left;   TOTAL SHOULDER REPLACEMENT Left 08/2022   TRIGGER FINGER RELEASE Right 11/01/2017   Procedure: RIGHT THUMB TRIGGER RELEASE;  Surgeon: Betha Loa, MD;  Location: [facility];  Service: Orthopedics;  Laterality: Right;   Social History   Social History Narrative   Not on file   Immunization History  Administered Date(s)  Administered   PFIZER(Purple Top)SARS-COV-2 Vaccination 01/24/2019, 02/14/2019, 05/22/2019, 09/18/2019, 04/03/2020   Pfizer Covid-19 Vaccine Bivalent Booster 35yrs & up 11/03/2020     Objective: Vital Signs: BP 124/77 (BP Location: Left Arm, Patient Position: Sitting, Cuff Size: Normal)   Pulse 68   Resp 14   Ht 5\' 4"  (1.626 m)   Wt 168 lb (76.2 kg)   BMI 28.84 kg/m    Physical Exam Eyes:     Conjunctiva/sclera: Conjunctivae normal.  Cardiovascular:     Rate and Rhythm: Normal rate and regular rhythm.  Pulmonary:     Effort: Pulmonary effort is normal.     Breath sounds: Normal breath sounds.  Skin:    General: Skin is warm and dry.     Findings: No bruising.  Neurological:     Mental Status: She is alert.  Psychiatric:        Mood and Affect: Mood normal.      Musculoskeletal Exam:  Elbows full ROM no tenderness or swelling Wrists full ROM no tenderness or swelling Fingers full ROM no tenderness or swelling Knees full ROM no tenderness or swelling  Investigation: No additional findings.  Imaging: MR BRAIN WO CONTRAST Result Date: 04/13/2023 CLINICAL DATA:  Lightheadedness with double vision and headaches for 6 weeks EXAM: MRI HEAD WITHOUT CONTRAST TECHNIQUE: Multiplanar, multiecho pulse sequences of the brain and surrounding structures were obtained without intravenous contrast. COMPARISON:  None Available. FINDINGS: Brain: No acute infarction, hemorrhage, hydrocephalus, extra-axial collection or mass lesion. Mild cerebral volume loss and FLAIR hyperintensity in the deep white matter. No abnormal mineralization. Vascular: Normal flow voids. Skull and upper cervical spine: Normal marrow signal. C2-3 anterolisthesis associated with facet degeneration. Sinuses/Orbits: Bilateral cataract resection. Postoperative right ethmoids. Presumed cosmetic injections in the bilateral mid face. IMPRESSION: Unremarkable MRI of the brain.  No specific cause for symptoms. Electronically  Signed   By: Tiburcio Pea M.D.   On: 04/13/2023 10:27    Recent Labs: Lab Results  Component Value Date   WBC 5.3 10/09/2022   HGB 11.5 (L) 10/09/2022   PLT 237 10/09/2022   NA 143 10/09/2022   K 4.6 10/09/2022   CL 107 10/09/2022   CO2 29 10/09/2022   GLUCOSE 85 10/09/2022   BUN 20 10/09/2022   CREATININE 0.87 10/09/2022   BILITOT 0.4 10/09/2022   ALKPHOS 71 05/06/2009  AST 19 10/09/2022   ALT 10 10/09/2022   PROT 6.1 10/09/2022   ALBUMIN 2.3 (L) 05/06/2009   CALCIUM 9.3 10/09/2022   GFRAA >60 08/14/2019    Speciality Comments: No specialty comments available.  Procedures:  No procedures performed Allergies: Lisinopril and Ibandronate   Assessment / Plan:     Visit Diagnoses: Age-related osteoporosis without current pathological fracture - She is doing well tolerating the Prolia injection, consistent with calcium vitamin D supplementation. DEXA scan be due sometime October or later. Osteoporosis Osteoporosis confirmed by T score of -2.6 in left forearm, improved from -3.0 in 2022 due to Prolia. Hip T score stable at -1.8. No fragility fractures or adverse effects from Prolia. - Continue prolia q33mos - Coordinate Prolia administration with healthcare provider.  Degenerative Disc Disease Significant lumbar spine degenerative disc disease complicates osteoporosis monitoring. Osteoarthritis affects spine, complicating bone density assessment.  Fall Risk Reported fall five weeks ago due to environmental hazard, no significant injuries. - Provide education on fall prevention and environmental safety.        Orders: No orders of the defined types were placed in this encounter.  No orders of the defined types were placed in this encounter.    Follow-Up Instructions: Return in about 1 year (around 04/12/2024) for OP on prolia f/u 37yr.   Fuller Plan, MD  Note - This record has been created using [employer].  Chart creation errors have been sought,  but may not always  have been located. Such creation errors do not reflect on  the standard of medical care.

## 2023-04-09 ENCOUNTER — Other Ambulatory Visit: Payer: Self-pay | Admitting: Internal Medicine

## 2023-04-09 DIAGNOSIS — Z79899 Other long term (current) drug therapy: Secondary | ICD-10-CM

## 2023-04-09 DIAGNOSIS — M810 Age-related osteoporosis without current pathological fracture: Secondary | ICD-10-CM

## 2023-04-09 NOTE — Telephone Encounter (Signed)
 Last Fill: 10/16/2023  Labs: 02/28/2023 CBC and CMP ([facility] Tab) WNL  10/09/2022 Vitamin D 59  Next Visit: 04/13/2023  Last Visit: 04/13/2022  DX: Age-related osteoporosis without current pathological fracture   Current Dose per office note 04/13/2022: not mentioned  11/23/2022 Bone Density. Per pharmacy visit note on 10/23/2022, next Prolia due on 04/21/2023.  Okay to refill Prolia?

## 2023-04-13 ENCOUNTER — Ambulatory Visit: Payer: Medicare HMO | Attending: Internal Medicine | Admitting: Internal Medicine

## 2023-04-13 ENCOUNTER — Encounter: Payer: Self-pay | Admitting: Internal Medicine

## 2023-04-13 ENCOUNTER — Other Ambulatory Visit: Payer: Self-pay

## 2023-04-13 VITALS — BP 124/77 | HR 68 | Resp 14 | Ht 64.0 in | Wt 168.0 lb

## 2023-04-13 DIAGNOSIS — M810 Age-related osteoporosis without current pathological fracture: Secondary | ICD-10-CM | POA: Diagnosis not present

## 2023-04-13 DIAGNOSIS — M25512 Pain in left shoulder: Secondary | ICD-10-CM | POA: Diagnosis not present

## 2023-04-13 DIAGNOSIS — G8929 Other chronic pain: Secondary | ICD-10-CM | POA: Diagnosis not present

## 2023-04-13 DIAGNOSIS — R001 Bradycardia, unspecified: Secondary | ICD-10-CM | POA: Diagnosis not present

## 2023-04-13 MED ORDER — PROLIA 60 MG/ML ~~LOC~~ SOSY
60.0000 mg | PREFILLED_SYRINGE | SUBCUTANEOUS | 0 refills | Status: DC
  Filled 2023-04-13: qty 1, 180d supply, fill #0

## 2023-04-16 ENCOUNTER — Other Ambulatory Visit: Payer: Self-pay

## 2023-04-16 ENCOUNTER — Telehealth: Payer: Self-pay | Admitting: Pharmacist

## 2023-04-16 ENCOUNTER — Other Ambulatory Visit: Payer: Self-pay | Admitting: Pharmacy Technician

## 2023-04-16 ENCOUNTER — Other Ambulatory Visit (HOSPITAL_COMMUNITY): Payer: Self-pay

## 2023-04-16 DIAGNOSIS — Z79899 Other long term (current) drug therapy: Secondary | ICD-10-CM

## 2023-04-16 DIAGNOSIS — M810 Age-related osteoporosis without current pathological fracture: Secondary | ICD-10-CM

## 2023-04-16 MED ORDER — PROLIA 60 MG/ML ~~LOC~~ SOSY
60.0000 mg | PREFILLED_SYRINGE | SUBCUTANEOUS | 0 refills | Status: DC
Start: 2023-04-16 — End: 2023-10-08
  Filled 2023-04-16: qty 1, 180d supply, fill #0

## 2023-04-16 MED ORDER — DENOSUMAB 60 MG/ML ~~LOC~~ SOSY
60.0000 mg | PREFILLED_SYRINGE | Freq: Once | SUBCUTANEOUS | Status: AC
  Administered 2023-04-24: 60 mg via SUBCUTANEOUS

## 2023-04-16 NOTE — Telephone Encounter (Addendum)
 Patient due for Prolia on 04/21/23. Per test claim, copay is $250. Spoke with patient - she is aware of copay and will pay this. Prolia appt scheduled for 04/24/23  Geraldene Kleine, PharmD, MPH, BCPS, CPP Clinical Pharmacist (Rheumatology and Pulmonology)  ----- Message from Gwenette Lennox sent at 04/13/2023 10:44 AM EDT ----- Regarding: PROLIA DUE Patient is in office asking status of Prolia that's due. Thank you.

## 2023-04-16 NOTE — Progress Notes (Signed)
 Specialty Pharmacy Refill Coordination Note  Laura Bean is a 73 y.o. female contacted today regarding refills of specialty medication(s) Denosumab (Prolia)   Patient requested Courier to Provider Office   Delivery date: 04/17/23   Verified address: [address], [place], [place] [address]   Medication will be filled on 04/17/23.  Patient appt is on 04/24/23

## 2023-04-17 NOTE — Progress Notes (Signed)
 Pharmacy Note  Subjective:   Patient presents to clinic today to receive bi-annual dose of Prolia . Patient's last dose of Prolia  was on 10/23/2022  Patient running a fever or have signs/symptoms of infection? No  Patient currently on antibiotics for the treatment of infection? No  Patient had fall in the last 6 months?  Yes  If yes, did it require medical attention? No ; she fell retrieving an item from a 45 degree angled ground.  Patient taking calcium  1200 mg daily through diet or supplement and at least 800 units vitamin D ? Yes  Objective: CMP     Component Value Date/Time   NA 143 10/09/2022 1423   K 4.6 10/09/2022 1423   CL 107 10/09/2022 1423   CO2 29 10/09/2022 1423   GLUCOSE 85 10/09/2022 1423   BUN 20 10/09/2022 1423   CREATININE 0.87 10/09/2022 1423   CALCIUM  9.3 10/09/2022 1423   PROT 6.1 10/09/2022 1423   ALBUMIN 2.3 (L) 05/06/2009 0600   AST 19 10/09/2022 1423   ALT 10 10/09/2022 1423   ALKPHOS 71 05/06/2009 0600   BILITOT 0.4 10/09/2022 1423   GFRNONAA 55 (L) 08/14/2019 0940   GFRAA >60 08/14/2019 0940    CBC    Component Value Date/Time   WBC 5.3 10/09/2022 1423   RBC 3.89 10/09/2022 1423   HGB 11.5 (L) 10/09/2022 1423   HCT 36.0 10/09/2022 1423   PLT 237 10/09/2022 1423   MCV 92.5 10/09/2022 1423   MCH 29.6 10/09/2022 1423   MCHC 31.9 (L) 10/09/2022 1423   RDW 12.2 10/09/2022 1423   LYMPHSABS 1,691 10/09/2022 1423   MONOABS 0.6 08/14/2019 0940   EOSABS 170 10/09/2022 1423   BASOSABS 58 10/09/2022 1423    Lab Results  Component Value Date   VD25OH 59 10/09/2022   10/07/20 DG Mobile Bone Density Left forearm                 0.512   -3.0 Left femur neck           0.720   -1.2   10/17/17 DEXA - Lunar Right femur neck         0.781   -1.8 Left femur neck                       -0.97  Assessment/Plan:   Reviewed importance of adequate dietary intake of calcium  in addition to supplementation due to risk of hypocalcemia with Prolia .   Patient  tolerated injection well.   Administration details as below: Administrations This Visit     denosumab  (PROLIA ) injection 60 mg     Admin Date 04/24/2023 Action Given Dose 60 mg Route Subcutaneous Documented By Thais Fill, RPH-CPP           Patient's next Prolia  dose is due on 10/21/2023.  Patient is due for updated DEXA in October 2025. Future order placed for [facility]   All questions encouraged and answered.  Instructed patient to call with any further questions or concerns.  Geraldene Kleine, PharmD, MPH, BCPS, CPP Clinical Pharmacist (Rheumatology and Pulmonology)

## 2023-04-17 NOTE — Progress Notes (Signed)
 Initial neurology clinic note  Laura Bean MRN: [id number] DOB: 1950-02-09  Referring provider: Elida Grounds, DO  Primary care provider: Jannelle Memory, MD (Inactive)  Reason for consult:  vision changes, lightheadedness  Subjective:  This is Ms. Laura Bean, a 73 y.o. right-handed female with a medical history of HTN, osteoporosis, OA, B12 deficiency, GERD, depression, nephrolithiasis, lumbar degenerative disc disease, diverticulosis, gastric bypass surgery who presents to neurology clinic with vision changes, lightheadedness. The patient is alone today.  Patient has had vision issues for years. She had cataract surgery (~10 years ago). Over the last several years, it has been difficult for her to see clear and get glasses that work well. She saw an eye doctor who told her she had nodules from RK surgery. She had another surgery 1.5 years ago to correct this but it did not change her symptoms much. She mentions she did not get the other eye done though.  She has blurry and double vision that improves with closing one eye. The double vision is worse when looking to the right. Her eyes feel "heavy" particularly when tired. She is not sure if symptoms fluctuate. She has some drooping of eyelids.   She denies any difficulty speaking, chewing, swallowing. She denies shortness of breath but does sleep elevated due to acid reflux. She denies arm weakness. She has had a left shoulder replacement that sometimes makes reaching up on that side difficult. She has OA in both knees so has some difficulty due to those. If she stands or walks for a longer periods of time, her legs feel tired.  She also has lightheadedness she describes as a fuzzy feeling like when you've had one drink to many. It is constant. She does not notice it when she is laying down though. It is not particularly worse when initially standing.  MRI brain wo contrast on 03/26/23 was normal.  Of note, patient is on B12  supplementation and B complex and gabapentin  100 mg at bedtime (for muscle cramps, but not any symptoms for a while).  EtOH use: rare Restrictive diet: No Family history of neurologic disorders: father maybe had dementia (in 45s)  MEDICATIONS:  Outpatient Encounter Medications as of 04/26/2023  Medication Sig   acetaminophen  (TYLENOL  8 HOUR) 650 MG CR tablet 2 tablets Orally *every 8 hrs for pain (OVER THE COUNTER) as needed   amoxicillin (AMOXIL) 500 MG tablet Before dental procedures.   Azelastine HCl 137 MCG/SPRAY SOLN 2 sprays.   B Complex Vitamins (VITAMIN-B COMPLEX PO) Take by mouth.   calcium  carbonate (OSCAL) 1500 (600 Ca) MG TABS tablet Take 600 mg of elemental calcium  by mouth 2 (two) times daily with a meal.    Carboxymethylcellulose Sodium (EYE DROPS OP) Apply to eye. (Patient not taking: Reported on 04/13/2023)   cholecalciferol  (VITAMIN D ) 1000 units tablet Take 1,000 Units by mouth daily.    denosumab  (PROLIA ) 60 MG/ML SOSY injection Inject 60 mg into the skin every 6 (six) months. Courier to rheum: [address], [place] [address]. Appt on 04/24/23   Famotidine (PEPCID PO) Take by mouth.   fluoruracil (CARAC) 0.5 % cream Apply 1 application topically 2 (two) times daily.   gabapentin  (NEURONTIN ) 100 MG capsule Take 100 mg by mouth at bedtime.   Multiple Vitamin (MULTIVITAMIN) capsule Take 1 capsule by mouth daily.   omeprazole (PRILOSEC) 40 MG capsule Take 40 mg by mouth daily.   Oxymetazoline HCl (NASAL SPRAY) 0.05 % SOLN 2 sprays in each nostril  as needed   Semaglutide,0.25 or 0.5MG /DOS, 2 MG/1.5ML SOPN Inject into the skin.   tiZANidine  (ZANAFLEX ) 2 MG tablet Take 1 tablet (2 mg total) by mouth every 6 (six) hours as needed.   traZODone (DESYREL) 50 MG tablet 1 tablet at bedtime Orally Once a day   valsartan -hydrochlorothiazide  (DIOVAN -HCT) 80-12.5 MG tablet Take 1 tablet by mouth at bedtime.    vitamin B-12 (CYANOCOBALAMIN ) 1000 MCG tablet Take 1,000 mcg by  mouth daily.    Facility-Administered Encounter Medications as of 04/26/2023  Medication   [COMPLETED] denosumab  (PROLIA ) injection 60 mg   [START ON 10/14/2023] denosumab  (PROLIA ) injection 60 mg    PAST MEDICAL HISTORY: Past Medical History:  Diagnosis Date   Arthritis    Depression    Family history of adverse reaction to anesthesia    ? father confusion with anesthesia when older   GERD (gastroesophageal reflux disease)    occ   History of kidney stones    Hypertension    Osteoporosis     PAST SURGICAL HISTORY: Past Surgical History:  Procedure Laterality Date   ADENOIDECTOMY     child   EYE SURGERY     cataract   ROUX-EN-Y GASTRIC BYPASS  2002   SHOULDER SURGERY Left 08/2022   SINUS EXPLORATION     THIGH LIFT     outer   TOTAL KNEE ARTHROPLASTY Left 08/14/2016   Procedure: TOTAL KNEE ARTHROPLASTY;  Surgeon: Wendolyn Hamburger, MD;  Location: [facility];  Service: Orthopedics;  Laterality: Left;   TOTAL KNEE REVISION Left 08/25/2019   Procedure: LEFT TOTAL KNEE REVISION;  Surgeon: Wendolyn Hamburger, MD;  Location: [facility];  Service: Orthopedics;  Laterality: Left;   TOTAL SHOULDER REPLACEMENT Left 08/2022   TRIGGER FINGER RELEASE Right 11/01/2017   Procedure: RIGHT THUMB TRIGGER RELEASE;  Surgeon: Brunilda Capra, MD;  Location: [facility];  Service: Orthopedics;  Laterality: Right;    ALLERGIES: Allergies  Allergen Reactions   Lisinopril Cough    Other reaction(s): Cough (ALLERGY/intolerance)   Ibandronate     Other reaction(s): myalgias, headache (10/2020)    FAMILY HISTORY: Family History  Problem Relation Age of Onset   Aortic stenosis Mother    Arrhythmia Mother    Breast cancer Mother    Osteoarthritis Mother    Heart disease Mother    Aortic stenosis Father    Melanoma Father    Osteoarthritis Father    Breast cancer Sister     SOCIAL HISTORY: Social History   Tobacco Use   Smoking status: Former    Current packs/day: 0.00    Average  packs/day: 0.1 packs/day for 25.0 years (2.5 ttl pk-yrs)    Types: Cigarettes    Start date: 08/09/1975    Quit date: 08/08/2000    Years since quitting: 22.7   Smokeless tobacco: Never   Tobacco comments:    Off/on for 25 years - social smoker Stopped smoking 30 years ago. 04/26/23  Vaping Use   Vaping status: Never Used  Substance Use Topics   Alcohol use: Yes    Comment: rarely   Drug use: No   Social History   Social History Narrative   Not on file    Objective:  Vital Signs:  BP 136/74   Pulse 80   Ht 5\' 4"  (1.626 m)   Wt 166 lb (75.3 kg)   SpO2 99%   BMI 28.49 kg/m   General: General appearance: Awake and alert. No distress. Cooperative with exam.  Skin: No  obvious rash or jaundice. HEENT: Atraumatic. Anicteric. Lungs: Non-labored breathing on room air  Heart: Regular Extremities: No edema. No obvious deformity.  Musculoskeletal: No obvious joint swelling. Psych: Affect appropriate.  Neurological: Mental Status: Alert. Speech fluent. No pseudobulbar affect Cranial Nerves: CNII: No RAPD. Visual fields intact. CNIII, IV, VI: PERRL. No nystagmus. EOMI. Diplopia with up gaze that fluctuates with extended up gaze. No diplopia with right gaze as she had during history. CN V: Facial sensation intact bilaterally to fine touch.  CN VII: Facial muscles mildly weak at orbicularis oris and orbicularis oculi. Ptosis at rest, right greater than left. Possibly worse with sustained up gaze. Ptosis worse on contralateral side when holding other eyelid open. Cogan lid twitch sign equivocal. Prior to ice pack test: right eye 7 mm; left eye 1 mm; post ice pack: right eye 11 mm; left eye 1 mm CN VIII: Hears finger rub well bilaterally. CN IX: No hypophonia. CN X: Palate elevates symmetrically. CN XI: Full strength shoulder shrug bilaterally. CN XII: Tongue protrusion full and midline. No atrophy or fasciculations. No significant dysarthria Motor: Tone is normal.  Individual  muscle group testing (MRC grade out of 5):  Movement     Neck flexion 5    Neck extension 5     Right Left   Shoulder abduction 5 5   Elbow flexion 5 5   Elbow extension 5 5   Finger abduction - FDI 5 5   Finger abduction - ADM 5 5   Finger extension 5 5   Finger distal flexion - 2/3 5 5    Finger distal flexion - 4/5 5 5    Thumb flexion - FPL 5 5   Thumb abduction - APB 4+ 4+    Hip flexion 5 5   Hip extension 5 5   Hip adduction 5 5   Hip abduction 5 5   Knee extension 5 5   Knee flexion 5 5   Dorsiflexion 5 5   Plantarflexion 5 5    Reflexes:  Right Left   Bicep 2+ 2+   Tricep 2+ 2+   BrRad 2+ 2+   Knee 2+ 2+   Ankle 0 0    Pathological Reflexes: Hoffman: absent bilaterally Troemner: absent bilaterally Sensation: Pinprick: Intact in all extremities Coordination: Intact finger-to- nose-finger bilaterally. Romberg negative. Gait: Able to rise from chair with arms crossed unassisted. Normal, narrow-based gait.  Labs and Imaging review: External labs: 02/28/23: HbA1c: 5.7 CMP unremarkable CBC unremarkable  03/30/22: Lipid panel: tChol 237, LDL 131, TG 86 Vit D wnl B12: 602  Imaging/Procedures: MRI brain wo contrast (03/26/23): FINDINGS: Brain: No acute infarction, hemorrhage, hydrocephalus, extra-axial collection or mass lesion. Mild cerebral volume loss and FLAIR hyperintensity in the deep white matter. No abnormal mineralization.   Vascular: Normal flow voids.   Skull and upper cervical spine: Normal marrow signal. C2-3 anterolisthesis associated with facet degeneration.   Sinuses/Orbits: Bilateral cataract resection. Postoperative right ethmoids. Presumed cosmetic injections in the bilateral mid face.   IMPRESSION: Unremarkable MRI of the brain.  No specific cause for symptoms.  Assessment/Plan:  Afrika Brick is a 73 y.o. female who presents for evaluation of lightheadedness and diplopia. She has a relevant medical history of HTN, osteoporosis,  OA, B12 deficiency, GERD, depression, nephrolithiasis, lumbar degenerative disc disease, diverticulosis, gastric bypass surgery. Her neurological examination is pertinent for ptosis, diplopia, both worse with up gaze, diplopia worse when holding other eyelid open, and improvement of ptosis with ice pack test. Available  diagnostic data is significant for MRI brain that was unremarkable. The etiology is currently unclear, particularly regarding her lightheadedness, but I am concerned about ptosis and diplopia possibly being myasthenia gravis. Thyroid  eye disease or partial CN palsy is also possible.   PLAN: -Blood work: AChR abs, TSH -May consider EMG with RNS if negative vs mestinon trial -Discussed warning signs of MG crisis  -Return to clinic in about 2 months  The impression above as well as the plan as outlined below were extensively discussed with the patient who voiced understanding. All questions were answered to their satisfaction.  When available, results of the above investigations and possible further recommendations will be communicated to the patient via telephone/MyChart. Patient to call office if not contacted after expected testing turnaround time.   Total time spent reviewing records, interview, history/exam, documentation, and coordination of care on day of encounter:  55 min   Thank you for allowing me to participate in patient's care.  If I can answer any additional questions, I would be pleased to do so.  Rommie Coats, MD   CC: Jannelle Memory, MD (Inactive) [address] [place] [address]  CC: Referring provider: Elida Grounds, DO [address] [place],  [place] [address]

## 2023-04-24 ENCOUNTER — Ambulatory Visit: Attending: Internal Medicine | Admitting: Pharmacist

## 2023-04-24 DIAGNOSIS — Z79899 Other long term (current) drug therapy: Secondary | ICD-10-CM

## 2023-04-24 DIAGNOSIS — M810 Age-related osteoporosis without current pathological fracture: Secondary | ICD-10-CM

## 2023-04-24 DIAGNOSIS — Z7689 Persons encountering health services in other specified circumstances: Secondary | ICD-10-CM

## 2023-04-24 MED ORDER — DENOSUMAB 60 MG/ML ~~LOC~~ SOSY: 60.0000 mg | PREFILLED_SYRINGE | SUBCUTANEOUS | Status: DC

## 2023-04-24 NOTE — Patient Instructions (Addendum)
 Order for DEXA placed (due in October 2025)

## 2023-04-26 ENCOUNTER — Other Ambulatory Visit

## 2023-04-26 ENCOUNTER — Encounter: Payer: Self-pay | Admitting: Neurology

## 2023-04-26 ENCOUNTER — Ambulatory Visit: Payer: Self-pay | Admitting: Neurology

## 2023-04-26 VITALS — BP 136/74 | HR 80 | Ht 64.0 in | Wt 166.0 lb

## 2023-04-26 DIAGNOSIS — R42 Dizziness and giddiness: Secondary | ICD-10-CM

## 2023-04-26 DIAGNOSIS — H532 Diplopia: Secondary | ICD-10-CM

## 2023-04-26 DIAGNOSIS — H02403 Unspecified ptosis of bilateral eyelids: Secondary | ICD-10-CM | POA: Diagnosis not present

## 2023-04-26 NOTE — Patient Instructions (Addendum)
 I saw you today for double vision and lightheadedness. I noticed you also had droopy eyelids. I wonder if this may be an autoimmune condition called myasthenia gravis. It could be something else though.  I want to start by sending some lab work. This is often positive in people with myasthenia gravis and if positive would mean you have the disorder and then we will start treatment. If it is negative, we will likely have more testing to do. I will be in touch when I have the results.  Please let me know if you have any questions or concerns in the meantime.  Go to nearest emergency room if you have severe weakness, difficulty breathing or swallowing as this could be a myasthenic crisis (flare).  I will see you back in clinic in about 2 months.  The physicians and staff at [facility] are committed to providing excellent care. You may receive a survey requesting feedback about your experience at our office. We strive to receive "very good" responses to the survey questions. If you feel that your experience would prevent you from giving the office a "very good " response, please contact our office to try to remedy the situation. We may be reached at [phone]. Thank you for taking the time out of your busy day to complete the survey.  Rommie Coats, MD [facility]

## 2023-05-03 ENCOUNTER — Ambulatory Visit: Payer: Self-pay | Admitting: Neurology

## 2023-05-03 LAB — MYASTHENIA GRAVIS PANEL 2
A CHR BINDING ABS: <0.3 nmol/L
ACHR Blocking Abs: <15 %{inhibition} (ref <15)
Acetylchol Modul Ab: 12 %{inhibition}

## 2023-05-03 LAB — TSH: TSH: 2.81 m[IU]/L (ref 0.40–4.50)

## 2023-05-04 ENCOUNTER — Encounter: Payer: Self-pay | Admitting: Neurology

## 2023-05-08 ENCOUNTER — Telehealth: Payer: Self-pay | Admitting: Neurology

## 2023-05-08 ENCOUNTER — Other Ambulatory Visit: Payer: Self-pay

## 2023-05-08 DIAGNOSIS — R202 Paresthesia of skin: Secondary | ICD-10-CM

## 2023-05-08 NOTE — Telephone Encounter (Signed)
 Called patient to discuss results of labs. Her TSH and MG antibodies (AChR binding, blocking, and modulating) were normal. I am still suspicious of MG though and up to 50% of patients with ocular MG can be antibody negative. For this reason I recommended EMG with RNS, which patient agreed to do.  She will be scheduled for 05/14/23 at 11 am.   All questions were answered.  Rommie Coats, MD [facility]

## 2023-05-14 ENCOUNTER — Ambulatory Visit: Admitting: Neurology

## 2023-05-14 ENCOUNTER — Telehealth: Payer: Self-pay | Admitting: Neurology

## 2023-05-14 DIAGNOSIS — H02403 Unspecified ptosis of bilateral eyelids: Secondary | ICD-10-CM

## 2023-05-14 DIAGNOSIS — H532 Diplopia: Secondary | ICD-10-CM

## 2023-05-14 DIAGNOSIS — R202 Paresthesia of skin: Secondary | ICD-10-CM

## 2023-05-14 MED ORDER — PYRIDOSTIGMINE BROMIDE 60 MG PO TABS
60.0000 mg | ORAL_TABLET | Freq: Three times a day (TID) | ORAL | 0 refills | Status: DC
Start: 2023-05-14 — End: 2023-06-08

## 2023-05-14 NOTE — Procedures (Signed)
 [facility]  [address] [place], [address]  [place], [place] [address] Tel: [phone] Fax: [phone] Test Date:  05/14/2023  Patient: Laura Bean DOB: [birth year] Physician: Rommie Coats, MD  Sex: Female Height: 5\' 4"  Ref Phys: Rommie Coats, MD  ID#: [id number]   Technician:    History: This is a 73 year old female with diplopia and lightheadedness.  NCV & EMG Findings: Extensive electrodiagnostic evaluation of the right upper and lower limbs with additional nerve conduction studies of the right spinal accessory and facial nerves and 3 Hz repetitive nerve stimulation of the right median, spinal accessory, and facial motor nerves shows: Right sural sensory response is absent. Right median and ulnar sensory responses are within normal limits. Right peroneal/fibular (EDB), median (APB), spinal accessory (trapezius), and facial (nasalis) motor responses are within normal limits. 3 Hz repetitive nerve stimulation of the right spinal accessory (trapezius) nerve shows abnormal decrement (> 10 %).  3 Hz repetitive nerve stimulation of the right median (APB) and facial (nasalis) do not show abnormal decrement. There is no evidence of active or chronic motor axon loss changes affecting any of the tested muscles on needle examination. Motor unit configuration and recruitment pattern is within normal limits.  Impression: This study shows: No definitive electrodiagnostic evidence of a defect of neuromuscular junction transmission. 1 of 3 tested nerves showed abnormal decrement, which may be seen in myasthenia gravis, but given other 2 tested nerves show no abnormal decrement, the finding is too limited in degree and distribution for definitive diagnosis. Absent right sural sensory response is of unclear clinical significance and may be normal for age or technical in nature due to patient's physiognomy. No electrodiagnostic evidence of a right cervical (C5-C8) or lumbosacral (L2-S1) motor  radiculopathy. No electrodiagnostic evidence of myopathy.   ___________________________ Rommie Coats, MD    NCS+ Motor Nerve Results    Latency Amplitude F-Lat Segment Distance CV Comment  Site (ms) Norm (mV) Norm (ms)  (cm) (m/s) Norm   Right Facial - Nasalis Motor  Mastoid 2.7  < 4.2 2.8  > 1.00        Right Fibular (EDB) Motor  Ankle 3.9  < 6.0 2.6  > 2.5        Bel fib head 10.7 - 2.2 -  Bel fib head-Ankle 32 47  > 40   Pop fossa 12.5 - 2.0 -  Pop fossa-Bel fib head 8 44 -   Right Median (APB) Motor  Wrist 2.6  < 4.0 6.0  > 5.0        Elbow 7.5 - 5.9 -  Elbow-Wrist 26.5 54  > 50   Right Spinal Accessory Motor  Neck 3.0 - 3.4 -         Sensory Sites    Neg Peak Lat Amplitude (O-P) Segment Distance Velocity Comment  Site (ms) Norm (V) Norm  (cm) (ms)   Right Median Sensory  Wrist-Dig II 3.4  < 3.8 22  > 10 Wrist-Dig II 13    Right Sural Sensory  Calf-Lat mall *NR  < 4.6 *NR  > 3 Calf-Lat mall 14    Right Ulnar Sensory  Wrist-Dig V 2.7  < 3.2 20  > 5 Wrist-Dig V 11     RNS   Trial # Label Amp 1 (mV)  O-P Amp 5 (mV)  O-P Amp % Dif Area 1 (mVms) Area 5 (mVms) Area % Dif Rep Rate Train Length Pause Time (min:sec) Comments  Right Abductor pollicis brevis  Tr 1 Baseline 6.03 6.00 -0.6 16.35 15.47 -5.4 3.00 10 00:30   Tr 2 Post Exercise 6.00 6.02 0.4 16.18 15.09 -6.7 3.00 10 01:00   Tr 3 1 min Post 5.83 5.84 0.1 15.22 14.61 -4.0 3.00 10 01:00   Tr 4 2 min Post 5.67 5.68 0.2 14.23 13.84 -2.7 3.00 10 01:00   Tr 5 3 min Post 5.84 5.81 -0.5 14.99 14.37 -4.1 3.00 10 00:00   Right Trapezius (upper)  Tr 1 Baseline 2.45 2.14 -12.8 12.40 10.51 -15.3 3.00 10 00:30   Tr 2 Post Exercise 2.77 2.54 -8.2 13.32 11.76 -11.7 3.00 10 01:00   Tr 3 1 min Post 2.68 2.40 -10.3 12.47 10.93 -12.4 3.00 10 01:00   Tr 4 2 min Post 2.64 2.38 -10.1 12.64 10.96 -13.3 3.00 10 01:00   Tr 5 3 min Post 2.57 2.32 -9.5 12.55 10.74 -14.4 3.00 10 00:00   Right Nasalis  Tr 1 Baseline 2.53 2.37 -6.0 7.44  5.78 -22.3 3.00 10 00:30   Tr 2 Post Exercise 2.15 2.19 1.9 5.32 5.28 -0.9 3.00 10 01:00   Tr 3 1 min Post 1.98 2.06 3.8 4.72 4.89 3.6 3.00 10 01:00   Tr 4 2 min Post 2.26 2.07 -8.7 6.78 4.93 -27.2 3.00 10 01:00   Tr 5 3 min Post 1.95 2.11 8.3 4.74 5.13 8.1 3.00 10 00:00             EMG+   Side Muscle Ins.Act Fibs Fasc Recrt Amp Dur Poly Activation Comment  Right Tib ant Nml Nml Nml Nml Nml Nml Nml Nml N/A  Right Gastroc MH Nml Nml Nml Nml Nml Nml Nml Nml N/A  Right Vastus lat Nml Nml Nml Nml Nml Nml Nml Nml N/A  Right Iliacus Nml Nml Nml Nml Nml Nml Nml Nml N/A  Right Biceps fem SH Nml Nml Nml Nml Nml Nml Nml Nml N/A  Right FDI Nml Nml Nml Nml Nml Nml Nml Nml N/A  Right Pronator teres Nml Nml Nml Nml Nml Nml Nml Nml N/A  Right Biceps Nml Nml Nml Nml Nml Nml Nml Nml N/A  Right Triceps lat hd Nml Nml Nml Nml Nml Nml Nml Nml N/A  Right Deltoid Nml Nml Nml Nml Nml Nml Nml Nml N/A      Waveforms:  Motor           Sensory

## 2023-05-14 NOTE — Telephone Encounter (Signed)
 Discussed the results of patient's EMG after the procedure today. It showed abnormal decrement in 1 of 3 muscles testing (trapezius). This is not diagnostic for myasthenia gravis, though RNS can be normal in ~30% of patients with MG. Given that clinically her symptoms still could be MG, we discussed options including referral for single fiber EMG, trial of mestinon, or monitoring symptoms. Patient would like to trial mestinon. I sent Mestinon 60 mg TID to patient's pharmacy. She will let us  know how she does with it. She will follow up with me as planned in 07/2023.  All questions were answered.  Rommie Coats, MD [facility]

## 2023-05-16 ENCOUNTER — Encounter: Payer: Self-pay | Admitting: Neurology

## 2023-05-23 ENCOUNTER — Other Ambulatory Visit: Payer: Self-pay

## 2023-05-23 DIAGNOSIS — H02403 Unspecified ptosis of bilateral eyelids: Secondary | ICD-10-CM

## 2023-05-23 DIAGNOSIS — H532 Diplopia: Secondary | ICD-10-CM

## 2023-05-29 ENCOUNTER — Encounter: Payer: Self-pay | Admitting: Neurology

## 2023-06-06 ENCOUNTER — Other Ambulatory Visit: Payer: Self-pay | Admitting: Neurology

## 2023-06-06 DIAGNOSIS — H532 Diplopia: Secondary | ICD-10-CM

## 2023-06-06 DIAGNOSIS — H02403 Unspecified ptosis of bilateral eyelids: Secondary | ICD-10-CM

## 2023-07-11 ENCOUNTER — Encounter: Payer: Self-pay | Admitting: Neurology

## 2023-07-20 NOTE — Progress Notes (Deleted)
 I saw Laura Bean in neurology clinic on 08/02/23 in follow up for diplopia and lightheadedness.  HPI: Laura Bean is a 73 y.o. year old female with a history of HTN, osteoporosis, OA, B12 deficiency, GERD, depression, nephrolithiasis, lumbar degenerative disc disease, diverticulosis, gastric bypass surgery who we last saw on 04/26/23.  To briefly review: 04/26/23: Patient has had vision issues for years. She had cataract surgery (~10 years ago). Over the last several years, it has been difficult for her to see clear and get glasses that work well. She saw an eye doctor who told her she had nodules from RK surgery. She had another surgery 1.5 years ago to correct this but it did not change her symptoms much. She mentions she did not get the other eye done though.   She has blurry and double vision that improves with closing one eye. The double vision is worse when looking to the right. Her eyes feel heavy particularly when tired. She is not sure if symptoms fluctuate. She has some drooping of eyelids.    She denies any difficulty speaking, chewing, swallowing. She denies shortness of breath but does sleep elevated due to acid reflux. She denies arm weakness. She has had a left shoulder replacement that sometimes makes reaching up on that side difficult. She has OA in both knees so has some difficulty due to those. If she stands or walks for a longer periods of time, her legs feel tired.   She also has lightheadedness she describes as a fuzzy feeling like when you've had one drink to many. It is constant. She does not notice it when she is laying down though. It is not particularly worse when initially standing.   MRI brain wo contrast on 03/26/23 was normal.   Of note, patient is on B12 supplementation and B complex and gabapentin  100 mg at bedtime (for muscle cramps, but not any symptoms for a while).   EtOH use: rare Restrictive diet: No Family history of neurologic disorders: father  maybe had dementia (in 48s)  Most recent Assessment and Plan (04/26/23): Laura Bean is a 73 y.o. female who presents for evaluation of lightheadedness and diplopia. She has a relevant medical history of HTN, osteoporosis, OA, B12 deficiency, GERD, depression, nephrolithiasis, lumbar degenerative disc disease, diverticulosis, gastric bypass surgery. Her neurological examination is pertinent for ptosis, diplopia, both worse with up gaze, diplopia worse when holding other eyelid open, and improvement of ptosis with ice pack test. Available diagnostic data is significant for MRI brain that was unremarkable. The etiology is currently unclear, particularly regarding her lightheadedness, but I am concerned about ptosis and diplopia possibly being myasthenia gravis. Thyroid  eye disease or partial CN palsy is also possible.     PLAN: -Blood work: AChR abs, TSH -May consider EMG with RNS if negative vs mestinon  trial -Discussed warning signs of MG crisis  Since their last visit: Labs, including AChR abs were normal. I recommended EMG w/ RNS which showed abnormal decrement at right spinal accessory nerve but not median or facial. Given the clinical concern for MG without clear diagnostic evidence for, we decided to pursue single fiber EMG at [facility] and trial mestinon  60 mg TID. Patient has GI side effects and HA making mestinon  too intolerable to continue taking. She did not see a clear benefit after taking the medication *** days. Single fiber was originally scheduled for 07/13/23 but had to be rescheduled to 08/17/23 due to machine issues. ***  Symptoms unchanged?  Current  MG symptoms: Ptosis: *** Double vision: *** Speech: *** Chewing: *** Swallowing: *** Breathing: *** Arm strength: *** Leg strength: ***  Current medications:  ***  Ppx: ***  Side effects: ***   ROS: Pertinent positive and negative systems reviewed in HPI. ***   MEDICATIONS:  Outpatient Encounter Medications as of  08/02/2023  Medication Sig   acetaminophen  (TYLENOL  8 HOUR) 650 MG CR tablet 2 tablets Orally *every 8 hrs for pain (OVER THE COUNTER) as needed   amoxicillin (AMOXIL) 500 MG tablet Before dental procedures. (Patient not taking: Reported on 04/26/2023)   Azelastine HCl 137 MCG/SPRAY SOLN 2 sprays.   B Complex Vitamins (VITAMIN-B COMPLEX PO) Take by mouth.   calcium  carbonate (OSCAL) 1500 (600 Ca) MG TABS tablet Take 600 mg of elemental calcium  by mouth 2 (two) times daily with a meal.    Carboxymethylcellulose Sodium (EYE DROPS OP) Apply to eye. (Patient not taking: Reported on 04/26/2023)   cholecalciferol  (VITAMIN D ) 1000 units tablet Take 1,000 Units by mouth daily.    denosumab  (PROLIA ) 60 MG/ML SOSY injection Inject 60 mg into the skin every 6 (six) months. Courier to rheum: [address], [address], [place] [address]. Appt on 04/24/23   Famotidine (PEPCID PO) Take by mouth.   fluoruracil (CARAC) 0.5 % cream Apply 1 application topically 2 (two) times daily.   gabapentin  (NEURONTIN ) 100 MG capsule Take 100 mg by mouth at bedtime.   Multiple Vitamin (MULTIVITAMIN) capsule Take 1 capsule by mouth daily.   omeprazole (PRILOSEC) 40 MG capsule Take 40 mg by mouth daily. (Patient not taking: Reported on 04/26/2023)   Oxymetazoline HCl (NASAL SPRAY) 0.05 % SOLN 2 sprays in each nostril as needed   pyridostigmine  (MESTINON ) 60 MG tablet TAKE 1 TABLET BY MOUTH 3 TIMES DAILY.   Semaglutide,0.25 or 0.5MG /DOS, 2 MG/1.5ML SOPN Inject into the skin.   tiZANidine  (ZANAFLEX ) 2 MG tablet Take 1 tablet (2 mg total) by mouth every 6 (six) hours as needed. (Patient not taking: Reported on 04/26/2023)   traZODone (DESYREL) 50 MG tablet 1 tablet at bedtime Orally Once a day   valsartan -hydrochlorothiazide  (DIOVAN -HCT) 80-12.5 MG tablet Take 1 tablet by mouth at bedtime.    vitamin B-12 (CYANOCOBALAMIN ) 1000 MCG tablet Take 1,000 mcg by mouth daily.    Facility-Administered Encounter Medications as of 08/02/2023   Medication   [START ON 10/14/2023] denosumab  (PROLIA ) injection 60 mg    PAST MEDICAL HISTORY: Past Medical History:  Diagnosis Date   Arthritis    Depression    Family history of adverse reaction to anesthesia    ? father confusion with anesthesia when older   GERD (gastroesophageal reflux disease)    occ   History of kidney stones    Hypertension    Osteoporosis     PAST SURGICAL HISTORY: Past Surgical History:  Procedure Laterality Date   ADENOIDECTOMY     child   EYE SURGERY     cataract   ROUX-EN-Y GASTRIC BYPASS  2002   SHOULDER SURGERY Left 08/2022   SINUS EXPLORATION     THIGH LIFT     outer   TOTAL KNEE ARTHROPLASTY Left 08/14/2016   Procedure: TOTAL KNEE ARTHROPLASTY;  Surgeon: Liam Lerner, MD;  Location: [facility] OR;  Service: Orthopedics;  Laterality: Left;   TOTAL KNEE REVISION Left 08/25/2019   Procedure: LEFT TOTAL KNEE REVISION;  Surgeon: Liam Lerner, MD;  Location: [facility];  Service: Orthopedics;  Laterality: Left;   TOTAL SHOULDER REPLACEMENT Left 08/2022  TRIGGER FINGER RELEASE Right 11/01/2017   Procedure: RIGHT THUMB TRIGGER RELEASE;  Surgeon: Murrell Drivers, MD;  Location: [facility];  Service: Orthopedics;  Laterality: Right;    ALLERGIES: Allergies  Allergen Reactions   Lisinopril Cough    Other reaction(s): Cough (ALLERGY/intolerance)   Ibandronate     Other reaction(s): myalgias, headache (10/2020)    FAMILY HISTORY: Family History  Problem Relation Age of Onset   Aortic stenosis Mother    Arrhythmia Mother    Breast cancer Mother    Osteoarthritis Mother    Heart disease Mother    Aortic stenosis Father    Melanoma Father    Osteoarthritis Father    Breast cancer Sister     SOCIAL HISTORY: Social History   Tobacco Use   Smoking status: Former    Current packs/day: 0.00    Average packs/day: 0.1 packs/day for 25.0 years (2.5 ttl pk-yrs)    Types: Cigarettes    Start date: 08/09/1975    Quit date: 08/08/2000     Years since quitting: 22.9   Smokeless tobacco: Never   Tobacco comments:    Off/on for 25 years - social smoker Stopped smoking 30 years ago. 04/26/23  Vaping Use   Vaping status: Never Used  Substance Use Topics   Alcohol use: Yes    Comment: rarely   Drug use: No   Social History   Social History Narrative   Not on file    Objective:  Vital Signs:  There were no vitals taken for this visit.  General:*** General appearance: Awake and alert. No distress. Cooperative with exam.  Skin: No obvious rash or jaundice. HEENT: Atraumatic. Anicteric. Lungs: Non-labored breathing on room air  Heart: Regular Abdomen: Soft, non tender. Extremities: No edema. No obvious deformity.  Musculoskeletal: No obvious joint swelling.  Neurological: Mental Status: Alert. Speech fluent. No pseudobulbar affect Cranial Nerves: CNII: No RAPD. Visual fields intact. CNIII, IV, VI: PERRL. No nystagmus. EOMI. CN V: Facial sensation intact bilaterally to fine touch. [place] clench strong. Jaw jerk***. CN VII: Facial muscles symmetric and strong. No ptosis at rest or after sustained upgaze***. CN VIII: Hears finger rub well bilaterally. CN IX: No hypophonia. CN X: Palate elevates symmetrically. CN XI: Full strength shoulder shrug bilaterally. CN XII: Tongue protrusion full and midline. No atrophy or fasciculations. No significant dysarthria*** Motor: Tone is ***. *** fasciculations in *** extremities. *** atrophy. No grip or percussive myotonia.  Individual muscle group testing (MRC grade out of 5):  Movement     Neck flexion ***    Neck extension ***     Right Left   Shoulder abduction *** ***   Shoulder adduction *** ***   Shoulder ext rotation *** ***   Shoulder int rotation *** ***   Elbow flexion *** ***   Elbow extension *** ***   Wrist extension *** ***   Wrist flexion *** ***   Finger abduction - FDI *** ***   Finger abduction - ADM *** ***   Finger extension *** ***    Finger distal flexion - 2/3 *** ***   Finger distal flexion - 4/5 *** ***   Thumb flexion - FPL *** ***   Thumb abduction - APB *** ***    Hip flexion *** ***   Hip extension *** ***   Hip adduction *** ***   Hip abduction *** ***   Knee extension *** ***   Knee flexion *** ***   Dorsiflexion *** ***  Plantarflexion *** ***   Inversion *** ***   Eversion *** ***   Great toe extension *** ***   Great toe flexion *** ***     Reflexes:  Right Left  Bicep *** ***  Tricep *** ***  BrRad *** ***  Knee *** ***  Ankle *** ***   Pathological Reflexes: Babinski: *** response bilaterally*** Hoffman: *** Troemner: *** Pectoral: *** Palmomental: *** Facial: *** Midline tap: *** Sensation: Pinprick: *** Vibration: *** Temperature: *** Proprioception: *** Coordination: Intact finger-to- nose-finger and heel-to-shin bilaterally. Romberg negative.*** Gait: Able to rise from chair with arms crossed unassisted. Normal, narrow-based gait. Able to tandem walk. Able to walk on toes and heels.***   Lab and Test Review: New results: 04/26/23: AChR antibodies (binding, blocking, and modulating): negative TSH wnl  EMG w/ RNS (05/14/23): NCV & EMG Findings: Extensive electrodiagnostic evaluation of the right upper and lower limbs with additional nerve conduction studies of the right spinal accessory and facial nerves and 3 Hz repetitive nerve stimulation of the right median, spinal accessory, and facial motor nerves shows: Right sural sensory response is absent. Right median and ulnar sensory responses are within normal limits. Right peroneal/fibular (EDB), median (APB), spinal accessory (trapezius), and facial (nasalis) motor responses are within normal limits. 3 Hz repetitive nerve stimulation of the right spinal accessory (trapezius) nerve shows abnormal decrement (> 10 %).  3 Hz repetitive nerve stimulation of the right median (APB) and facial (nasalis) do not show abnormal  decrement. There is no evidence of active or chronic motor axon loss changes affecting any of the tested muscles on needle examination. Motor unit configuration and recruitment pattern is within normal limits.   Impression: This study shows: No definitive electrodiagnostic evidence of a defect of neuromuscular junction transmission. 1 of 3 tested nerves showed abnormal decrement, which may be seen in myasthenia gravis, but given other 2 tested nerves show no abnormal decrement, the finding is too limited in degree and distribution for definitive diagnosis. Absent right sural sensory response is of unclear clinical significance and may be normal for age or technical in nature due to patient's physiognomy. No electrodiagnostic evidence of a right cervical (C5-C8) or lumbosacral (L2-S1) motor radiculopathy. No electrodiagnostic evidence of myopathy.  Previously reviewed results: 02/28/23: HbA1c: 5.7 CMP unremarkable CBC unremarkable   03/30/22: Lipid panel: tChol 237, LDL 131, TG 86 Vit D wnl B12: 602   MRI brain wo contrast (03/26/23): FINDINGS: Brain: No acute infarction, hemorrhage, hydrocephalus, extra-axial collection or mass lesion. Mild cerebral volume loss and FLAIR hyperintensity in the deep white matter. No abnormal mineralization.   Vascular: Normal flow voids.   Skull and upper cervical spine: Normal marrow signal. C2-3 anterolisthesis associated with facet degeneration.   Sinuses/Orbits: Bilateral cataract resection. Postoperative right ethmoids. Presumed cosmetic injections in the bilateral mid face.   IMPRESSION: Unremarkable MRI of the brain.  No specific cause for symptoms.  ASSESSMENT: This is Laura Bean, a 73 y.o. female with:  ***  Plan: ***  Return to clinic in ***  Total time spent reviewing records, interview, history/exam, documentation, and coordination of care on day of encounter:  *** min  Venetia Potters, MD

## 2023-07-28 ENCOUNTER — Ambulatory Visit (INDEPENDENT_AMBULATORY_CARE_PROVIDER_SITE_OTHER)

## 2023-07-28 ENCOUNTER — Ambulatory Visit
Admission: RE | Admit: 2023-07-28 | Discharge: 2023-07-28 | Disposition: A | Discharge status: Home / Self Care | Source: Ambulatory Visit | Attending: Family Medicine | Admitting: Family Medicine

## 2023-07-28 VITALS — BP 143/91 | HR 64 | Temp 98.0°F | Resp 16

## 2023-07-28 DIAGNOSIS — M25522 Pain in left elbow: Secondary | ICD-10-CM

## 2023-07-28 DIAGNOSIS — S52135A Nondisplaced fracture of neck of left radius, initial encounter for closed fracture: Secondary | ICD-10-CM

## 2023-07-28 NOTE — ED Provider Notes (Addendum)
 [facility]    CSN: [id number] Arrival date & time: 07/28/23  1054      History   Chief Complaint Chief Complaint  Patient presents with   Arm Injury    fell and having left elbow pain. Can bend arm but it's painful - Entered by patient   Fall    HPI Laura Bean is a 73 y.o. female presents for elbow pain after fall.  Patient reports yesterday she excellently stepped on the lid of her yard waste been causing her to land on her left elbow.  She denies head injury or LOC.  Reports since then she has been having pain to the medial aspect of her elbow primarily with extension and flexion.  Does state it feels swollen but denies bruising numbness or tingling.  Denies any shoulder pain, wrist pain or hand pain.  No history of injuries or surgeries to the elbow in the past.  She has been taking Tylenol  for symptoms.  No other concerns at this time   Arm Injury Fall    Past Medical History:  Diagnosis Date   Arthritis    Depression    Family history of adverse reaction to anesthesia    ? father confusion with anesthesia when older   GERD (gastroesophageal reflux disease)    occ   History of kidney stones    Hypertension    Osteoporosis     Patient Active Problem List   Diagnosis Date Noted   Bradycardia 04/13/2022   Degeneration of lumbar intervertebral disc 12/14/2020   Diverticulosis of colon 12/14/2020   Essential hypertension 12/14/2020   Gastroesophageal reflux disease 12/14/2020   History of depression 12/14/2020   Kidney stone 12/14/2020   Osteoporosis 12/14/2020   Obesity 12/14/2020   Postsurgical malabsorption, not elsewhere classified 12/14/2020   Primary osteoarthritis, unspecified ankle and foot 12/14/2020   Severe major depression, single episode, without psychotic features (HCC) 12/14/2020   Sleep disturbance 12/14/2020   Vitamin B12 deficiency (non anemic) 12/14/2020   Pain in left shoulder 12/14/2020   Painful total knee replacement, left  (HCC) 08/20/2019   Primary osteoarthritis of left knee 08/14/2016   Degenerative arthritis of left knee 08/11/2016    Past Surgical History:  Procedure Laterality Date   ADENOIDECTOMY     child   EYE SURGERY     cataract   ROUX-EN-Y GASTRIC BYPASS  2002   SHOULDER SURGERY Left 08/2022   SINUS EXPLORATION     THIGH LIFT     outer   TOTAL KNEE ARTHROPLASTY Left 08/14/2016   Procedure: TOTAL KNEE ARTHROPLASTY;  Surgeon: Liam Lerner, MD;  Location: [facility];  Service: Orthopedics;  Laterality: Left;   TOTAL KNEE REVISION Left 08/25/2019   Procedure: LEFT TOTAL KNEE REVISION;  Surgeon: Liam Lerner, MD;  Location: [facility];  Service: Orthopedics;  Laterality: Left;   TOTAL SHOULDER REPLACEMENT Left 08/2022   TRIGGER FINGER RELEASE Right 11/01/2017   Procedure: RIGHT THUMB TRIGGER RELEASE;  Surgeon: Murrell Drivers, MD;  Location: [facility];  Service: Orthopedics;  Laterality: Right;    OB History   No obstetric history on file.      Home Medications    Prior to Admission medications   Medication Sig Start Date End Date Taking? Authorizing Provider  acetaminophen  (TYLENOL  8 HOUR) 650 MG CR tablet 2 tablets Orally *every 8 hrs for pain (OVER THE COUNTER) as needed 07/19/20   [provider]  amoxicillin (AMOXIL) 500 MG tablet Before dental procedures.  Patient not taking: Reported on 04/26/2023 02/06/23   [provider]  Azelastine HCl 137 MCG/SPRAY SOLN 2 sprays.    [provider]  B Complex Vitamins (VITAMIN-B COMPLEX PO) Take by mouth.    [provider]  calcium  carbonate (OSCAL) 1500 (600 Ca) MG TABS tablet Take 600 mg of elemental calcium  by mouth 2 (two) times daily with a meal.     [provider]  Carboxymethylcellulose Sodium (EYE DROPS OP) Apply to eye. Patient not taking: Reported on 04/26/2023    [provider]  cholecalciferol  (VITAMIN D ) 1000 units tablet Take 1,000 Units by mouth daily.     [provider]  denosumab  (PROLIA ) 60 MG/ML SOSY injection Inject 60 mg into the skin every 6 (six) months. Courier to rheum: [address], [place] [address]. Appt on 04/24/23 04/16/23   Jeannetta Lonni ORN, MD  Famotidine (PEPCID PO) Take by mouth.    [provider]  fluoruracil (CARAC) 0.5 % cream Apply 1 application topically 2 (two) times daily.    [provider]  gabapentin  (NEURONTIN ) 100 MG capsule Take 100 mg by mouth at bedtime. 11/30/20   [provider]  Multiple Vitamin (MULTIVITAMIN) capsule Take 1 capsule by mouth daily.    [provider]  omeprazole (PRILOSEC) 40 MG capsule Take 40 mg by mouth daily. Patient not taking: Reported on 04/26/2023 09/19/20   [provider]  Oxymetazoline HCl (NASAL SPRAY) 0.05 % SOLN 2 sprays in each nostril as needed    [provider]  pyridostigmine  (MESTINON ) 60 MG tablet TAKE 1 TABLET BY MOUTH 3 TIMES DAILY. 06/08/23   Leigh Venetia CROME, MD  Semaglutide,0.25 or 0.5MG /DOS, 2 MG/1.5ML SOPN Inject into the skin.    [provider]  tiZANidine  (ZANAFLEX ) 2 MG tablet Take 1 tablet (2 mg total) by mouth every 6 (six) hours as needed. Patient not taking: Reported on 04/26/2023 08/25/19   Phillips, Eric K, PA-C  traZODone (DESYREL) 50 MG tablet 1 tablet at bedtime Orally Once a day    [provider]  valsartan -hydrochlorothiazide  (DIOVAN -HCT) 80-12.5 MG tablet Take 1 tablet by mouth at bedtime.     [provider]  vitamin B-12 (CYANOCOBALAMIN ) 1000 MCG tablet Take 1,000 mcg by mouth daily.     [provider]    Family History Family History  Problem Relation Age of Onset   Aortic stenosis Mother    Arrhythmia Mother    Breast cancer Mother    Osteoarthritis Mother    Heart disease Mother    Aortic stenosis Father    Melanoma Father    Osteoarthritis Father    Breast cancer Sister     Social History Social History   Tobacco Use   Smoking  status: Former    Current packs/day: 0.00    Average packs/day: 0.1 packs/day for 25.0 years (2.5 ttl pk-yrs)    Types: Cigarettes    Start date: 08/09/1975    Quit date: 08/08/2000    Years since quitting: 22.9   Smokeless tobacco: Never   Tobacco comments:    Off/on for 25 years - social smoker Stopped smoking 30 years ago. 04/26/23  Vaping Use   Vaping status: Never Used  Substance Use Topics   Alcohol use: Yes    Comment: rarely   Drug use: No     Allergies   Lisinopril and Ibandronate   Review of Systems Review of Systems  Musculoskeletal:  Left elbow pain after fall      Physical Exam Triage Vital Signs ED Triage Vitals  Encounter Vitals Group     BP 07/28/23 1112 (!) 143/91     Girls Systolic BP Percentile --      Girls Diastolic BP Percentile --      Boys Systolic BP Percentile --      Boys Diastolic BP Percentile --      Pulse Rate 07/28/23 1112 64     Resp 07/28/23 1112 16     Temp 07/28/23 1112 98 F (36.7 C)     Temp Source 07/28/23 1112 Oral     SpO2 07/28/23 1112 97 %     Weight --      Height --      Head Circumference --      Peak Flow --      Pain Score 07/28/23 1110 3     Pain Loc --      Pain Education --      Exclude from Growth Chart --    No data found.  Updated Vital Signs BP (!) 143/91 (BP Location: Right Arm)   Pulse 64   Temp 98 F (36.7 C) (Oral)   Resp 16   SpO2 97%   Visual Acuity Right Eye Distance:   Left Eye Distance:   Bilateral Distance:    Right Eye Near:   Left Eye Near:    Bilateral Near:     Physical Exam Vitals and nursing note reviewed.  Constitutional:      General: She is not in acute distress.    Appearance: Normal appearance. She is not ill-appearing.  HENT:     Head: Normocephalic and atraumatic.  Eyes:     Pupils: Pupils are equal, round, and reactive to light.  Cardiovascular:     Rate and Rhythm: Normal rate.  Pulmonary:     Effort: Pulmonary effort is normal.  Musculoskeletal:      Left elbow: No swelling, deformity, effusion or lacerations. Normal range of motion. Tenderness present in medial epicondyle. No radial head, lateral epicondyle or olecranon process tenderness.     Left wrist: Normal. No snuff box tenderness.     Comments: Strength 5 out of 5 bilateral upper extremities  Skin:    General: Skin is warm and dry.  Neurological:     General: No focal deficit present.     Mental Status: She is alert and oriented to person, place, and time.  Psychiatric:        Mood and Affect: Mood normal.        Behavior: Behavior normal.      UC Treatments / Results  Labs (all labs ordered are listed, but only abnormal results are displayed) Labs Reviewed - No data to display  EKG   Radiology DG Elbow Complete Left Result Date: 07/28/2023 CLINICAL DATA:  Fall onto elbow yesterday, pain. EXAM: LEFT ELBOW - COMPLETE 3+ VIEW COMPARISON:  None Available. FINDINGS: Positive for elbow joint effusion. Question subtle impaction fracture of the radial neck, although not definite. No other fracture of the elbow. The alignment and joint spaces are preserved. IMPRESSION: Positive for elbow joint effusion. Question subtle impaction fracture of the radial neck, although not definite. Recommend orthopedic follow-up. Electronically Signed   By: Andrea Gasman M.D.   On: 07/28/2023 11:31    Procedures Procedures (including critical care time)  Medications Ordered in UC Medications - No data to display  Initial Impression / Assessment  and Plan / UC Course  I have reviewed the triage vital signs and the nursing notes.  Pertinent labs & imaging results that were available during my care of the patient were reviewed by me and considered in my medical decision making (see chart for details).     Reviewed exam and symptoms with patient.  X-ray shows elbow effusion with possible impaction fracture of the radial neck.  Given this patient placed in a fiberglass sugar-tong splint.   Patient states she has a sling at home.  Will have her follow-up with orthopedics, Beverley Millman, she states she is already established with an orthopedic doctor and will call them first thing Monday morning for further treatment.  She states over-the-counter analgesics are sufficient for her pain.  Discussed RICE therapy.  Strict ER precautions reviewed and patient verbalized understanding. Final Clinical Impressions(s) / UC Diagnoses   Final diagnoses:  Left elbow pain  Closed nondisplaced fracture of neck of left radius, initial encounter     Discharge Instructions      Keep your arm in the splint until you are seen by orthopedics.  Please contact your orthopedic office first thing Monday morning to make them aware of these findings and they can follow-up with you and to determine if any additional treatment is indicated.  You may elevate and ice as needed.  Continue over-the-counter Tylenol  ibuprofen as needed.  Please go to the ER for any worsening symptoms that occur prior to seeing orthopedics.  This includes but is not limited to uncontrolled pain or swelling, persistent numbness or tingling, or any new concerns that arise.  Hope you feel better soon!     ED Prescriptions   None    PDMP not reviewed this encounter.   Loreda Myla SAUNDERS, NP 07/28/23 1147    Loreda Myla SAUNDERS, NP 07/28/23 [phone]

## 2023-07-28 NOTE — Discharge Instructions (Addendum)
 Keep your arm in the splint until you are seen by orthopedics.  Please contact your orthopedic office first thing Monday morning to make them aware of these findings and they can follow-up with you and to determine if any additional treatment is indicated.  You may elevate and ice as needed.  Continue over-the-counter Tylenol  ibuprofen as needed.  Please go to the ER for any worsening symptoms that occur prior to seeing orthopedics.  This includes but is not limited to uncontrolled pain or swelling, persistent numbness or tingling, or any new concerns that arise.  Hope you feel better soon!

## 2023-07-28 NOTE — ED Triage Notes (Addendum)
 The patient reports experiencing a fall yesterday, landing on her buttocks. She states that her left arm was impacted, possibly while attempting to brace herself during the fall. She is currently experiencing pain in the left arm radiating down to the wrist, which worsens with movement. No swelling is observed.  Home interventions: tylenol  (last night)

## 2023-08-02 ENCOUNTER — Ambulatory Visit: Admitting: Neurology

## 2023-08-13 NOTE — Progress Notes (Unsigned)
 I saw Laura Bean in neurology clinic on 08/02/23 in follow up for diplopia and lightheadedness.  HPI: Laura Bean is a 73 y.o. year old female with a history of HTN, osteoporosis, OA, B12 deficiency, GERD, depression, nephrolithiasis, lumbar degenerative disc disease, diverticulosis, gastric bypass surgery who we last saw on 04/26/23.  To briefly review: 04/26/23: Patient has had vision issues for years. She had cataract surgery (~10 years ago). Over the last several years, it has been difficult for her to see clear and get glasses that work well. She saw an eye doctor who told her she had nodules from RK surgery. She had another surgery 1.5 years ago to correct this but it did not change her symptoms much. She mentions she did not get the other eye done though.   She has blurry and double vision that improves with closing one eye. The double vision is worse when looking to the right. Her eyes feel heavy particularly when tired. She is not sure if symptoms fluctuate. She has some drooping of eyelids.    She denies any difficulty speaking, chewing, swallowing. She denies shortness of breath but does sleep elevated due to acid reflux. She denies arm weakness. She has had a left shoulder replacement that sometimes makes reaching up on that side difficult. She has OA in both knees so has some difficulty due to those. If she stands or walks for a longer periods of time, her legs feel tired.   She also has lightheadedness she describes as a fuzzy feeling like when you've had one drink to many. It is constant. She does not notice it when she is laying down though. It is not particularly worse when initially standing.   MRI brain wo contrast on 03/26/23 was normal.   Of note, patient is on B12 supplementation and B complex and gabapentin  100 mg at bedtime (for muscle cramps, but not any symptoms for a while).   EtOH use: rare Restrictive diet: No Family history of neurologic disorders: father  maybe had dementia (in 83s)  Most recent Assessment and Plan (04/26/23): Laura Bean is a 73 y.o. female who presents for evaluation of lightheadedness and diplopia. She has a relevant medical history of HTN, osteoporosis, OA, B12 deficiency, GERD, depression, nephrolithiasis, lumbar degenerative disc disease, diverticulosis, gastric bypass surgery. Her neurological examination is pertinent for ptosis, diplopia, both worse with up gaze, diplopia worse when holding other eyelid open, and improvement of ptosis with ice pack test. Available diagnostic data is significant for MRI brain that was unremarkable. The etiology is currently unclear, particularly regarding her lightheadedness, but I am concerned about ptosis and diplopia possibly being myasthenia gravis. Thyroid  eye disease or partial CN palsy is also possible.     PLAN: -Blood work: AChR abs, TSH -May consider EMG with RNS if negative vs mestinon  trial -Discussed warning signs of MG crisis  Since their last visit: Labs, including AChR abs were normal. I recommended EMG w/ RNS which showed abnormal decrement at right spinal accessory nerve but not median or facial. Given the clinical concern for MG without clear diagnostic evidence for, we decided to pursue single fiber EMG at [facility] and trial mestinon  60 mg TID. Patient has GI side effects and HA making mestinon  too intolerable to continue taking. She did not see a clear benefit after taking the medication 1-2 days. She re-iterates that she saw no difference after taking it with the diplopia.  Single fiber was originally scheduled for 07/13/23 but had to  be rescheduled to 08/17/23 due to machine issues. She completed this on 08/14/23. Per conversation with performing physician, Dr. Elnora at [facility], sfEMG was borderline but negative. 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  Patient's symptoms are essentially unchanged. She has constant  diplopia. She feels like her eyelids hand low after being awake a short period of time. She denies any difficulty with speech, chewing, swallowing, or breathing. She denies weakness, but does feel heaviness in her arms and legs, like they don't feel right. She tends to have muscle aches frequently, legs more than arms.  She has not seen her eye doctor recently. She has never tried prisms.    MEDICATIONS:  Outpatient Encounter Medications as of 08/23/2023  Medication Sig   acetaminophen  (TYLENOL  8 HOUR) 650 MG CR tablet 2 tablets Orally *every 8 hrs for pain (OVER THE COUNTER) as needed   Azelastine HCl 137 MCG/SPRAY SOLN 2 sprays.   B Complex Vitamins (VITAMIN-B COMPLEX PO) Take by mouth.   calcium  carbonate (OSCAL) 1500 (600 Ca) MG TABS tablet Take 600 mg of elemental calcium  by mouth 2 (two) times daily with a meal.    cholecalciferol  (VITAMIN D ) 1000 units tablet Take 1,000 Units by mouth daily.    denosumab  (PROLIA ) 60 MG/ML SOSY injection Inject 60 mg into the skin every 6 (six) months. Courier to rheum: [address], [address], [place] [address]. Appt on 04/24/23   Famotidine (PEPCID PO) Take by mouth.   fluoruracil (CARAC) 0.5 % cream Apply 1 application topically 2 (two) times daily.   gabapentin  (NEURONTIN ) 100 MG capsule Take 100 mg by mouth at bedtime.   Multiple Vitamin (MULTIVITAMIN) capsule Take 1 capsule by mouth daily.   Oxymetazoline HCl (NASAL SPRAY) 0.05 % SOLN 2 sprays in each nostril as needed   Semaglutide,0.25 or 0.5MG /DOS, 2 MG/1.5ML SOPN Inject into the skin.   tiZANidine  (ZANAFLEX ) 2 MG tablet Take 1 tablet (2 mg total) by mouth every 6 (six) hours as needed.   traZODone (DESYREL) 50 MG tablet 1 tablet at bedtime Orally Once a day   valsartan -hydrochlorothiazide  (DIOVAN -HCT) 80-12.5 MG tablet Take 1 tablet by mouth at bedtime.    vitamin B-12 (CYANOCOBALAMIN ) 1000 MCG tablet Take 1,000 mcg by mouth daily.    amoxicillin (AMOXIL) 500 MG tablet Before dental  procedures. (Patient not taking: Reported on 08/23/2023)   Carboxymethylcellulose Sodium (EYE DROPS OP) Apply to eye. (Patient not taking: Reported on 08/23/2023)   omeprazole (PRILOSEC) 40 MG capsule Take 40 mg by mouth daily. (Patient not taking: Reported on 08/23/2023)   pyridostigmine  (MESTINON ) 60 MG tablet TAKE 1 TABLET BY MOUTH 3 TIMES DAILY.   Facility-Administered Encounter Medications as of 08/23/2023  Medication   [START ON 10/14/2023] denosumab  (PROLIA ) injection 60 mg    PAST MEDICAL HISTORY: Past Medical History:  Diagnosis Date   Arthritis    Depression    Family history of adverse reaction to anesthesia    ? father confusion with anesthesia when older   GERD (gastroesophageal reflux disease)    occ   History of kidney stones    Hypertension    Osteoporosis     PAST SURGICAL HISTORY: Past Surgical History:  Procedure Laterality Date   ADENOIDECTOMY     child   EYE SURGERY     cataract   ROUX-EN-Y GASTRIC BYPASS  2002   SHOULDER SURGERY Left 08/2022   SINUS EXPLORATION     THIGH LIFT  outer   TOTAL KNEE ARTHROPLASTY Left 08/14/2016   Procedure: TOTAL KNEE ARTHROPLASTY;  Surgeon: Liam Lerner, MD;  Location: [facility] OR;  Service: Orthopedics;  Laterality: Left;   TOTAL KNEE REVISION Left 08/25/2019   Procedure: LEFT TOTAL KNEE REVISION;  Surgeon: Liam Lerner, MD;  Location: [facility] ORS;  Service: Orthopedics;  Laterality: Left;   TOTAL SHOULDER REPLACEMENT Left 08/2022   TRIGGER FINGER RELEASE Right 11/01/2017   Procedure: RIGHT THUMB TRIGGER RELEASE;  Surgeon: Murrell Drivers, MD;  Location:  SURGERY CENTER;  Service: Orthopedics;  Laterality: Right;    ALLERGIES: Allergies  Allergen Reactions   Lisinopril Cough    Other reaction(s): Cough (ALLERGY/intolerance)   Ibandronate     Other reaction(s): myalgias, headache (10/2020)    FAMILY HISTORY: Family History  Problem Relation Age of Onset   Aortic stenosis Mother    Arrhythmia Mother    Breast  cancer Mother    Osteoarthritis Mother    Heart disease Mother    Aortic stenosis Father    Melanoma Father    Osteoarthritis Father    Breast cancer Sister     SOCIAL HISTORY: Social History   Tobacco Use   Smoking status: Former    Current packs/day: 0.00    Average packs/day: 0.1 packs/day for 25.0 years (2.5 ttl pk-yrs)    Types: Cigarettes    Start date: 08/09/1975    Quit date: 08/08/2000    Years since quitting: 23.0   Smokeless tobacco: Never   Tobacco comments:    Off/on for 25 years - social smoker Stopped smoking 30 years ago. 04/26/23  Vaping Use   Vaping status: Never Used  Substance Use Topics   Alcohol use: Yes    Comment: rarely   Drug use: No   Social History   Social History Narrative   Not on file    Objective:  Vital Signs:  BP 106/64   Pulse 67   Resp 20   Ht 5' 7 (1.702 m)   Wt 150 lb (68 kg)   SpO2 98%   BMI 23.49 kg/m   General: General appearance: Awake and alert. No distress. Cooperative with exam.  Skin: No obvious rash or jaundice. HEENT: Atraumatic. Anicteric. Lungs: Non-labored breathing on room air  Heart: Regular  Neurological: Mental Status: Alert. Speech fluent. No pseudobulbar affect Cranial Nerves: CNII: No RAPD. Visual fields intact. CNIII, IV, VI: PERRL. No nystagmus. EOMI. Diplopia only at straight gaze, not to left, right or with sustained up gaze today. CN V: Facial sensation intact bilaterally to fine touch. CN VII: Facial muscles symmetric and strong including orbicularis oris and orbicularis oculi. ?ptosis at rest that does not fluctuate with sustained up gaze. CN VIII: Hears finger rub well bilaterally. CN IX: No hypophonia. CN X: Palate elevates symmetrically. CN XI: Full strength shoulder shrug bilaterally. CN XII: Tongue protrusion full and midline. No atrophy or fasciculations. No significant dysarthria Motor: Tone is normal. Strength is 5/5 in bilateral upper and lower extremities with no fatigability.   Reflexes:  Right Left  Bicep 2+ 2+  Tricep 2+ 2+  BrRad 2+ 2+  Knee 2+ 2+  Ankle 0 0   Sensation intact to light touch in all extremities Coordination: Intact finger-to- nose-finger bilaterally.  Gait: Able to rise from chair with arms crossed unassisted. Normal, narrow-based gait.   Lab and Test Review: New results: 04/26/23: AChR antibodies (binding, blocking, and modulating): negative TSH wnl  EMG w/ RNS (05/14/23): NCV & EMG Findings: Extensive electrodiagnostic  evaluation of the right upper and lower limbs with additional nerve conduction studies of the right spinal accessory and facial nerves and 3 Hz repetitive nerve stimulation of the right median, spinal accessory, and facial motor nerves shows: Right sural sensory response is absent. Right median and ulnar sensory responses are within normal limits. Right peroneal/fibular (EDB), median (APB), spinal accessory (trapezius), and facial (nasalis) motor responses are within normal limits. 3 Hz repetitive nerve stimulation of the right spinal accessory (trapezius) nerve shows abnormal decrement (> 10 %).  3 Hz repetitive nerve stimulation of the right median (APB) and facial (nasalis) do not show abnormal decrement. There is no evidence of active or chronic motor axon loss changes affecting any of the tested muscles on needle examination. Motor unit configuration and recruitment pattern is within normal limits.   Impression: This study shows: No definitive electrodiagnostic evidence of a defect of neuromuscular junction transmission. 1 of 3 tested nerves showed abnormal decrement, which may be seen in myasthenia gravis, but given other 2 tested nerves show no abnormal decrement, the finding is too limited in degree and distribution for definitive diagnosis. Absent right sural sensory response is of unclear clinical significance and may be normal for age or technical in nature due to patient's physiognomy. No electrodiagnostic  evidence of a right cervical (C5-C8) or lumbosacral (L2-S1) motor radiculopathy. No electrodiagnostic evidence of myopathy.  Single fiber EMG ([facility] 08/17/23): Borderline but normal per report.  Previously reviewed results: 02/28/23: HbA1c: 5.7 CMP unremarkable CBC unremarkable   03/30/22: Lipid panel: tChol 237, LDL 131, TG 86 Vit D wnl B12: 602   MRI brain wo contrast (03/26/23): FINDINGS: Brain: No acute infarction, hemorrhage, hydrocephalus, extra-axial collection or mass lesion. Mild cerebral volume loss and FLAIR hyperintensity in the deep white matter. No abnormal mineralization.   Vascular: Normal flow voids.   Skull and upper cervical spine: Normal marrow signal. C2-3 anterolisthesis associated with facet degeneration.   Sinuses/Orbits: Bilateral cataract resection. Postoperative right ethmoids. Presumed cosmetic injections in the bilateral mid face.   IMPRESSION: Unremarkable MRI of the brain.  No specific cause for symptoms.  ASSESSMENT: This is Laura Bean, a 73 y.o. female with: Diplopia - constant, not fluctuating. AChR abs negative. EMG w/ RNS showed abnormal decrement only in trapezius but not median or facial nerves. sfEMG was normal, though borderline. Patient's examination is less concerning for MG today. I agree this could be due to strabismus. Perhaps prisms would help patient given it is constant. It is still possible that patient has mild MG though, particularly given her exam is different than prior (is her exam improved or fluctuating?).  Myalgias in arms and legs - no weakness. No myopathy on EMG. Unclear etiology currently.  Plan: -CK, aldolase -Recommend patient see her ophthalmologist for consideration of prisms -Patient to call with new or worsening symptoms  Return to clinic in 6 months  Total time spent reviewing records, interview, history/exam, documentation, and coordination of care on day of encounter:  30 min  Venetia Potters, MD

## 2023-08-23 ENCOUNTER — Encounter: Payer: Self-pay | Admitting: Neurology

## 2023-08-23 ENCOUNTER — Other Ambulatory Visit

## 2023-08-23 ENCOUNTER — Ambulatory Visit: Admitting: Neurology

## 2023-08-23 VITALS — BP 106/64 | HR 67 | Resp 20 | Ht 67.0 in | Wt 150.0 lb

## 2023-08-23 DIAGNOSIS — H532 Diplopia: Secondary | ICD-10-CM | POA: Diagnosis not present

## 2023-08-23 DIAGNOSIS — M791 Myalgia, unspecified site: Secondary | ICD-10-CM | POA: Diagnosis not present

## 2023-08-23 DIAGNOSIS — H02403 Unspecified ptosis of bilateral eyelids: Secondary | ICD-10-CM

## 2023-08-23 NOTE — Patient Instructions (Addendum)
 I will check a couple of muscle enzymes today. I will be in touch when I have the results.  I recommend you see your eye doctor and ask if prisms may help your double vision.  Please call me with any new or worsening symptoms.  I will see you back in clinic in 6 months so we can monitor your symptoms closely. Please let me know if you have any questions or concerns in the meantime.   The physicians and staff at [facility] are committed to providing excellent care. You may receive a survey requesting feedback about your experience at our office. We strive to receive very good responses to the survey questions. If you feel that your experience would prevent you from giving the office a very good  response, please contact our office to try to remedy the situation. We may be reached at [phone]. Thank you for taking the time out of your busy day to complete the survey.  Venetia Potters, MD [facility]

## 2023-08-24 ENCOUNTER — Ambulatory Visit: Payer: Self-pay | Admitting: Neurology

## 2023-08-27 ENCOUNTER — Other Ambulatory Visit: Payer: Self-pay

## 2023-08-27 DIAGNOSIS — H02403 Unspecified ptosis of bilateral eyelids: Secondary | ICD-10-CM

## 2023-08-27 DIAGNOSIS — H532 Diplopia: Secondary | ICD-10-CM

## 2023-08-27 DIAGNOSIS — M791 Myalgia, unspecified site: Secondary | ICD-10-CM

## 2023-08-27 LAB — ALDOLASE: Aldolase: 3.6 U/L (ref <=8.1)

## 2023-08-27 LAB — CK: Total CK: 39 U/L (ref 18–225)

## 2023-10-07 ENCOUNTER — Other Ambulatory Visit: Payer: Self-pay | Admitting: Internal Medicine

## 2023-10-07 DIAGNOSIS — M810 Age-related osteoporosis without current pathological fracture: Secondary | ICD-10-CM

## 2023-10-07 DIAGNOSIS — Z79899 Other long term (current) drug therapy: Secondary | ICD-10-CM

## 2023-10-08 ENCOUNTER — Telehealth: Payer: Self-pay | Admitting: Pharmacist

## 2023-10-08 ENCOUNTER — Other Ambulatory Visit: Payer: Self-pay

## 2023-10-08 ENCOUNTER — Other Ambulatory Visit (HOSPITAL_COMMUNITY): Payer: Self-pay

## 2023-10-08 DIAGNOSIS — M810 Age-related osteoporosis without current pathological fracture: Secondary | ICD-10-CM

## 2023-10-08 MED ORDER — JUBBONTI 60 MG/ML ~~LOC~~ SOSY
60.0000 mg | PREFILLED_SYRINGE | SUBCUTANEOUS | 0 refills | Status: AC
  Filled 2023-10-08: qty 1, 180d supply, fill #0

## 2023-10-08 NOTE — Progress Notes (Signed)
 Specialty Pharmacy Refill Coordination Note  Laura Bean is a 73 y.o. female assessed today regarding refills of clinic administered specialty medication(s) Denosumab -bbdz (Jubbonti)   Clinic requested Courier to Provider Office   Delivery date: 10/15/23   Verified address: [address], [place], [place] [address]   Medication will be filled on 10/12/23.   Appointment 10/22/23.

## 2023-10-08 NOTE — Telephone Encounter (Signed)
 Patient due for denosumab  on 10/21/2023. Prolia  is no longer preferred. Insurance requires Jubbonti. Test claim shows copay of $250. Patient aware of copay.   Patient scheduled for Jubbonti appt on 10/22/2023. Denies upcoming dental work. Reviewed that biosimilar is now preferred by her insurance.  Patient has CMP and Vitamin D  completed during her annual wellness visit on 10/05/2023.  She states calcium  was normal. Will continue to f/u - labs not yet release  Sherry Pennant, PharmD, MPH, BCPS, CPP Clinical Pharmacist [facility])

## 2023-10-11 ENCOUNTER — Other Ambulatory Visit: Payer: Self-pay

## 2023-10-12 NOTE — Telephone Encounter (Signed)
 CMP and Vitamin D  on 10/10/23 wnl (In CareEverywhere(. Can proceed with appt on 10/22/23  Janautica Netzley, PharmD, MPH, BCPS, CPP Clinical Pharmacist [facility])

## 2023-10-18 NOTE — Progress Notes (Cosign Needed Addendum)
 Pharmacy Note  Subjective:   Patient presents to clinic today to receive bi-annual dose of denosumab . Patient's last dose of denosumab  was on 04/24/2023. Her insurance no longer covers Prolia . Jubbonti is preferred denosumab  product. She denies upcoming dental work.  She has started semaglutide and reports approximately 30 pound weight loss since initiation. She has also had increased dizziness that is being worked up  Patient running a fever or have signs/symptoms of infection? No  Patient currently on antibiotics for the treatment of infection? No  Patient had fall in the last 6 months?  Yes  If yes, did it require medical attention? Yes . She was rolling her trash bin downhill on her driveway after completing. She lost her grip on the bin which fell with lid open. She stepped on the lid and fell. She did go to urgent care because the left elbow did not feel right (denied pain) and happened to be broken. She was provided mechanical support device for 3 weeks.  Patient taking calcium  1200 mg daily through diet or supplement and at least 800 units vitamin D ? Yes  Objective: CMP on 10/10/23 (In CareEverywhere) Calcium  9.6 wnl  Vitamin D  85.0 on 10/10/2023 (CareEverywhere)   11/23/22  Left forearm  0.650 -2.6 Left femur neck 0.790 -1.8  10/07/20 DG Mobile Bone Density Left forearm                 0.512   -3.0 Left femur neck           0.720   -1.2   10/17/17 DEXA - Lunar Right femur neck         0.781   -1.8 Left femur neck                       -0.97  Assessment/Plan:   Reviewed importance of adequate dietary intake of calcium  in addition to supplementation due to risk of hypocalcemia with denosumab .   Patient tolerated injection well.   Administration details as below: Administrations This Visit     denosumab -bbdz (JUBBONTI) injection 60 mg     Admin Date 10/22/2023 Action Given Dose 60 mg Route Subcutaneous Documented By Dayne Sherry RAMAN, RPH-CPP            Patient's next denosumab  dose is due on 04/19/2024.  Patient is due for updated DEXA in November 2026.   All questions encouraged and answered.  Instructed patient to call with any further questions or concerns.   Sherry Dayne, PharmD, MPH, BCPS, CPP Clinical Pharmacist [facility])

## 2023-10-22 ENCOUNTER — Ambulatory Visit: Attending: Internal Medicine | Admitting: Pharmacist

## 2023-10-22 DIAGNOSIS — Z7689 Persons encountering health services in other specified circumstances: Secondary | ICD-10-CM

## 2023-10-22 DIAGNOSIS — M810 Age-related osteoporosis without current pathological fracture: Secondary | ICD-10-CM

## 2023-10-22 MED ORDER — DENOSUMAB-BBDZ 60 MG/ML ~~LOC~~ SOSY
60.0000 mg | PREFILLED_SYRINGE | Freq: Once | SUBCUTANEOUS | Status: AC
  Administered 2023-10-22: 60 mg via SUBCUTANEOUS

## 2023-10-22 NOTE — Patient Instructions (Signed)
 Next Prolia  is due April 2026  Bone density is due in November 2026

## 2023-11-06 NOTE — Progress Notes (Signed)
 Laura Bean MRN: [id number] DOB: 13-Sep-1950 (age: 73 y.o.)   Return Visit PCP:  Lamar Bernhardt, MD Chief Complaint:  Chief Complaint  Patient presents with  . Left Thumb - Follow-up    f/u L thumb trigger s/p injection last saw Glendia Manuel   Last Visit: Visit date not found  History of Present Illness: Laura Bean is seen in follow-up regarding left thumb trigger digit.  This has been injected 2 times without lasting resolution.  She continues to have triggering of the thumb.  It is bothersome to her.  It is worse especially in the morning when she has to use the other hand to straighten the thumb out.  Allergies: Allergies[1]  Past Medical History: Medical History[2]  Past Surgical History: Surgical History[3]  Medications: Medications Ordered Prior to Encounter[4]  Family History: Family History[5]  Social History:    Vitals:  Vitals:   11/06/23 0849  BP: 140/69  Pulse: 67    Physical Exam:  Alert and oriented x 3. Well developed, well nourished. Regular gait.  Left upper extremity: Intact capillary refill in the fingertips.  There is good soft tissue turgor in the fingertips.  She can flex and extend the IP joint of the thumb and can cross her fingers.   No wounds. No swelling, erythema, or ecchymosis. No rashes or lesions.  No signs or symptoms of dystrophy.   Compartments are soft. There is a palpable and tender flexor tendon nodule at the volar aspect of the MP joint of the thumb. There is demonstrable triggering in the thumb.   Special Investigations- Review of Diagnostic Tests:   Radiologic Studies: No radiographs taken or reviewed today.  Assessment:    ICD-10-CM   1. Trigger finger of left thumb  M65.312       Plan: I discussed with Laura Bean the nature of trigger digits.  This has been injected previously without resolution.  We discussed surgical release.  Risks, benefits, and alternatives of surgery were discussed including risk of blood loss,  infection, damage to nerves, the vessels, tendons, ligaments, bone, need for additional surgery, complications with wound healing, continued pain, recurrence of triggering.  She voiced understanding of these risks and would like to proceed.  We will have it arranged at her convenience.   Follow up: Return for First post-op visit at 1 week.  No orders of the defined types were placed in this encounter.         [1] Allergies Allergen Reactions  . Lisinopril Cough    Other Reaction(s): Cough (ALLERGY/intolerance)  Other reaction(s): Cough (ALLERGY/intolerance)  . Ibandronate Sodium Myalgias    Other Reaction(s): myalgias, headache (10/2020)  [2] Past Medical History: Diagnosis Date  . Arthritis   . Depression   . GERD (gastroesophageal reflux disease)   . Hypertension   . Pseudophakia of both eyes 2017  [3] Past Surgical History: Procedure Laterality Date  . BREAST BIOPSY     Procedure: BREAST BIOPSY; pt unsure of which breast bx was performed  . CATARACT EXTRACTION Right 07/18/2017   Procedure: YAG CAPSULOTOMY  . CATARACT EXTRACTION Left 07/25/2017   Procedure: YAG CAPSULOTOMY  . CATARACT EXTRACTION W/  INTRAOCULAR LENS IMPLANT Right 02/17/2015   Procedure: CATARACT EXTRACTION W/  INTRAOCULAR LENS IMPLANT; Toric +ORA  . CATARACT EXTRACTION W/  INTRAOCULAR LENS IMPLANT Left 01/20/2015   Procedure: CATARACT EXTRACTION W/  INTRAOCULAR LENS IMPLANT; Standard +ORA  . GASTRIC BYPASS  2022  . REPLACEMENT TOTAL KNEE Left 2018   x2 2021  .  TOTAL SHOULDER REPLACEMENT Left 2024  [4] Current Outpatient Medications on File Prior to Visit  Medication Sig Dispense Refill  . valsartan  (DIOVAN ) 40 mg tablet Take 1 tablet by mouth daily.    SABRA azelastine (ASTELIN) 137 mcg (0.1 %) nasal spray 2 sprays.    . calcium  carbonate (OS-CAL) 1500 mg (600 mg calcium ) tablet Take  by mouth.    . cholecalciferol  (VITAMIN D3) 1,000 unit (25 mcg) tablet Take 1,000 Units by mouth.    .  cyanocobalamin  (VITAMIN B12) 1,000 mcg tablet Take 1,000 mcg by mouth.    . denosumab  (PROLIA ) 60 mg/mL syrg syringe Inject 60 mg under the skin.    . fluoruracil (CARAC) 0.5 % cream Apply topically.    . gabapentin  (NEURONTIN ) 100 mg capsule Take 100 mg by mouth nightly.    . multivitamin (THERAGRAN) tab tablet Take 1 tablet by mouth Once Daily.    SABRA omeprazole (PriLOSEC) 20 mg DR capsule Take 20 mg by mouth nightly.    . semaglutide (OZEMPIC) 0.25 mg or 0.5 mg(2 mg/1.5 mL) subcutaneous pen injector Inject under the skin.    . valsartan -hydroCHLOROthiazide  (DIOVAN  HCT) 160-12.5 mg per tablet Take 1 tablet by mouth Once Daily. (Patient not taking: Reported on 11/06/2023)    . vitamin B complex (B COMPLEX 1 ORAL)      No current facility-administered medications on file prior to visit.  [5] Family History Problem Relation Name Age of Onset  . Breast cancer Mother         46's  . Cataracts Mother    . Cancer Mother    . Macular degeneration Mother    . Hypertension Mother    . Heart disease Mother    . Arthritis Mother    . Cataracts Father    . Cancer Father    . Hypertension Father    . Cataracts Sister    . Cancer Sister    . Breast cancer Sister  79  . Breast cancer Maternal Grandmother    . Cataracts Brother    . Retinal detachment Brother    . Breast cancer Niece  81  . Diabetes Neg Hx    . Glaucoma Neg Hx    . Stroke Neg Hx    . Gout Neg Hx    . Thyroid  disease Neg Hx

## 2023-11-07 ENCOUNTER — Other Ambulatory Visit: Payer: Self-pay | Admitting: Orthopedic Surgery

## 2023-11-13 ENCOUNTER — Other Ambulatory Visit: Payer: Self-pay

## 2023-11-13 ENCOUNTER — Encounter (HOSPITAL_BASED_OUTPATIENT_CLINIC_OR_DEPARTMENT_OTHER): Payer: Self-pay | Admitting: Orthopedic Surgery

## 2023-11-14 ENCOUNTER — Other Ambulatory Visit: Payer: Self-pay

## 2023-11-14 ENCOUNTER — Encounter (HOSPITAL_BASED_OUTPATIENT_CLINIC_OR_DEPARTMENT_OTHER)
Admission: RE | Admit: 2023-11-14 | Discharge: 2023-11-14 | Disposition: A | Discharge status: Home / Self Care | Source: Ambulatory Visit | Attending: Orthopedic Surgery | Admitting: Orthopedic Surgery

## 2023-11-14 DIAGNOSIS — Z01810 Encounter for preprocedural cardiovascular examination: Secondary | ICD-10-CM | POA: Diagnosis present

## 2023-11-14 NOTE — Progress Notes (Signed)

## 2023-11-20 ENCOUNTER — Ambulatory Visit (HOSPITAL_BASED_OUTPATIENT_CLINIC_OR_DEPARTMENT_OTHER): Admitting: Anesthesiology

## 2023-11-20 ENCOUNTER — Ambulatory Visit (HOSPITAL_BASED_OUTPATIENT_CLINIC_OR_DEPARTMENT_OTHER)
Admission: RE | Admit: 2023-11-20 | Discharge: 2023-11-20 | Disposition: A | Discharge status: Home / Self Care | Attending: Orthopedic Surgery | Admitting: Orthopedic Surgery

## 2023-11-20 ENCOUNTER — Encounter (HOSPITAL_BASED_OUTPATIENT_CLINIC_OR_DEPARTMENT_OTHER)
Admission: RE | Disposition: A | Discharge status: Home / Self Care | Payer: Self-pay | Source: Home / Self Care | Attending: Orthopedic Surgery

## 2023-11-20 ENCOUNTER — Other Ambulatory Visit: Payer: Self-pay

## 2023-11-20 ENCOUNTER — Encounter (HOSPITAL_BASED_OUTPATIENT_CLINIC_OR_DEPARTMENT_OTHER): Payer: Self-pay | Admitting: Orthopedic Surgery

## 2023-11-20 DIAGNOSIS — Z79899 Other long term (current) drug therapy: Secondary | ICD-10-CM | POA: Insufficient documentation

## 2023-11-20 DIAGNOSIS — Z87891 Personal history of nicotine dependence: Secondary | ICD-10-CM | POA: Diagnosis not present

## 2023-11-20 DIAGNOSIS — M65312 Trigger thumb, left thumb: Secondary | ICD-10-CM | POA: Insufficient documentation

## 2023-11-20 DIAGNOSIS — I10 Essential (primary) hypertension: Secondary | ICD-10-CM | POA: Insufficient documentation

## 2023-11-20 HISTORY — PX: TRIGGER FINGER RELEASE: SHX641

## 2023-11-20 SURGERY — RELEASE, A1 PULLEY, FOR TRIGGER FINGER
Anesthesia: General | Site: Thumb | Laterality: Left

## 2023-11-20 MED ORDER — PHENYLEPHRINE HCL (PRESSORS) 10 MG/ML IV SOLN
INTRAVENOUS | Status: DC | PRN
Start: 2023-11-20 — End: 2023-11-20
  Administered 2023-11-20: 80 ug via INTRAVENOUS
  Administered 2023-11-20: 160 ug via INTRAVENOUS
  Administered 2023-11-20: 80 ug via INTRAVENOUS

## 2023-11-20 MED ORDER — LIDOCAINE 2% (20 MG/ML) 5 ML SYRINGE
INTRAMUSCULAR | Status: AC
Start: 2023-11-20 — End: 2023-11-20
  Filled 2023-11-20: qty 5

## 2023-11-20 MED ORDER — PROPOFOL 10 MG/ML IV BOLUS
INTRAVENOUS | Status: DC | PRN
Start: 2023-11-20 — End: 2023-11-20
  Administered 2023-11-20: 150 mg via INTRAVENOUS
  Administered 2023-11-20: 50 mg via INTRAVENOUS

## 2023-11-20 MED ORDER — AMISULPRIDE (ANTIEMETIC) 5 MG/2ML IV SOLN: 10.0000 mg | Freq: Once | INTRAVENOUS | Status: DC | PRN

## 2023-11-20 MED ORDER — CEFAZOLIN SODIUM-DEXTROSE 2-4 GM/100ML-% IV SOLN
2.0000 g | INTRAVENOUS | Status: AC
  Administered 2023-11-20: 2 g via INTRAVENOUS

## 2023-11-20 MED ORDER — ACETAMINOPHEN 10 MG/ML IV SOLN
INTRAVENOUS | Status: AC
Start: 2023-11-20 — End: 2023-11-20
  Filled 2023-11-20: qty 100

## 2023-11-20 MED ORDER — PROPOFOL 10 MG/ML IV BOLUS
INTRAVENOUS | Status: AC
  Filled 2023-11-20: qty 20

## 2023-11-20 MED ORDER — OXYCODONE HCL 5 MG PO TABS: 5.0000 mg | ORAL_TABLET | Freq: Once | ORAL | Status: DC | PRN

## 2023-11-20 MED ORDER — ONDANSETRON HCL 4 MG/2ML IJ SOLN
INTRAMUSCULAR | Status: AC
  Filled 2023-11-20: qty 2

## 2023-11-20 MED ORDER — LACTATED RINGERS IV SOLN: INTRAVENOUS | Status: DC

## 2023-11-20 MED ORDER — CEFAZOLIN SODIUM-DEXTROSE 2-4 GM/100ML-% IV SOLN
INTRAVENOUS | Status: AC
Start: 2023-11-20 — End: 2023-11-20
  Filled 2023-11-20: qty 100

## 2023-11-20 MED ORDER — DEXAMETHASONE SODIUM PHOSPHATE 4 MG/ML IJ SOLN
INTRAMUSCULAR | Status: DC | PRN
  Administered 2023-11-20: 5 mg via INTRAVENOUS

## 2023-11-20 MED ORDER — FENTANYL CITRATE (PF) 100 MCG/2ML IJ SOLN
INTRAMUSCULAR | Status: AC
  Filled 2023-11-20: qty 2

## 2023-11-20 MED ORDER — 0.9 % SODIUM CHLORIDE (POUR BTL) OPTIME
TOPICAL | Status: DC | PRN
  Administered 2023-11-20: 50 mL

## 2023-11-20 MED ORDER — BUPIVACAINE HCL (PF) 0.25 % IJ SOLN
INTRAMUSCULAR | Status: DC | PRN
  Administered 2023-11-20: 9 mL

## 2023-11-20 MED ORDER — ONDANSETRON HCL 4 MG/2ML IJ SOLN
INTRAMUSCULAR | Status: DC | PRN
  Administered 2023-11-20: 4 mg via INTRAVENOUS

## 2023-11-20 MED ORDER — HYDROCODONE-ACETAMINOPHEN 5-325 MG PO TABS: 1.0000 | ORAL_TABLET | Freq: Four times a day (QID) | ORAL | 0 refills | Status: AC | PRN

## 2023-11-20 MED ORDER — FENTANYL CITRATE (PF) 100 MCG/2ML IJ SOLN: 25.0000 ug | INTRAMUSCULAR | Status: DC | PRN

## 2023-11-20 MED ORDER — FENTANYL CITRATE (PF) 100 MCG/2ML IJ SOLN
INTRAMUSCULAR | Status: DC | PRN
  Administered 2023-11-20 (×4): 25 ug via INTRAVENOUS

## 2023-11-20 MED ORDER — ACETAMINOPHEN 10 MG/ML IV SOLN
INTRAVENOUS | Status: DC | PRN
  Administered 2023-11-20: 1000 mg via INTRAVENOUS

## 2023-11-20 MED ORDER — PHENYLEPHRINE 80 MCG/ML (10ML) SYRINGE FOR IV PUSH (FOR BLOOD PRESSURE SUPPORT)
PREFILLED_SYRINGE | INTRAVENOUS | Status: AC
  Filled 2023-11-20: qty 10

## 2023-11-20 MED ORDER — OXYCODONE HCL 5 MG/5ML PO SOLN: 5.0000 mg | Freq: Once | ORAL | Status: DC | PRN

## 2023-11-20 MED ORDER — ACETAMINOPHEN 500 MG PO TABS: 1000.0000 mg | ORAL_TABLET | Freq: Once | ORAL | Status: DC

## 2023-11-20 MED ORDER — LIDOCAINE 2% (20 MG/ML) 5 ML SYRINGE
INTRAMUSCULAR | Status: DC | PRN
  Administered 2023-11-20: 60 mg via INTRAVENOUS

## 2023-11-20 SURGICAL SUPPLY — 31 items
BLADE SURG 15 STRL LF DISP TIS (BLADE) ×2 IMPLANT
BNDG COHESIVE 2X5 TAN ST LF (GAUZE/BANDAGES/DRESSINGS) ×1 IMPLANT
BNDG COMPR ESMARK 4X3 LF (GAUZE/BANDAGES/DRESSINGS) IMPLANT
CHLORAPREP W/TINT 26 (MISCELLANEOUS) ×1 IMPLANT
CORD BIPOLAR FORCEPS 12FT (ELECTRODE) ×1 IMPLANT
COVER BACK TABLE 60X90IN (DRAPES) ×1 IMPLANT
COVER MAYO STAND STRL (DRAPES) ×1 IMPLANT
CUFF TOURN SGL QUICK 18X4 (TOURNIQUET CUFF) ×2 IMPLANT
DRAPE EXTREMITY T 121X128X90 (DISPOSABLE) ×1 IMPLANT
DRAPE SURG 17X23 STRL (DRAPES) ×1 IMPLANT
GAUZE SPONGE 4X4 12PLY STRL (GAUZE/BANDAGES/DRESSINGS) ×2 IMPLANT
GAUZE XEROFORM 1X8 LF (GAUZE/BANDAGES/DRESSINGS) ×1 IMPLANT
GLOVE BIO SURGEON STRL SZ7 (GLOVE) IMPLANT
GLOVE BIO SURGEON STRL SZ7.5 (GLOVE) ×1 IMPLANT
GLOVE BIOGEL PI IND STRL 7.0 (GLOVE) IMPLANT
GLOVE BIOGEL PI IND STRL 7.5 (GLOVE) IMPLANT
GLOVE BIOGEL PI IND STRL 8 (GLOVE) ×2 IMPLANT
GLOVE SURG SS PI 7.0 STRL IVOR (GLOVE) IMPLANT
GOWN STRL REUS W/ TWL LRG LVL3 (GOWN DISPOSABLE) ×2 IMPLANT
GOWN STRL REUS W/ TWL XL LVL3 (GOWN DISPOSABLE) IMPLANT
GOWN STRL REUS W/TWL XL LVL3 (GOWN DISPOSABLE) ×1 IMPLANT
NDL HYPO 25X1 1.5 SAFETY (NEEDLE) ×1 IMPLANT
NEEDLE HYPO 25X1 1.5 SAFETY (NEEDLE) ×1 IMPLANT
PACK BASIN DAY SURGERY FS (CUSTOM PROCEDURE TRAY) ×1 IMPLANT
SOLN 0.9% NACL POUR BTL 1000ML (IV SOLUTION) ×1 IMPLANT
STOCKINETTE 4X48 STRL (DRAPES) ×2 IMPLANT
SUT ETHILON 4 0 PS 2 18 (SUTURE) ×2 IMPLANT
SYR BULB EAR ULCER 3OZ GRN STR (SYRINGE) ×1 IMPLANT
SYR CONTROL 10ML LL (SYRINGE) ×1 IMPLANT
TOWEL GREEN STERILE FF (TOWEL DISPOSABLE) ×2 IMPLANT
UNDERPAD 30X36 HEAVY ABSORB (UNDERPADS AND DIAPERS) ×2 IMPLANT

## 2023-11-20 NOTE — Anesthesia Preprocedure Evaluation (Addendum)
 Anesthesia Evaluation  Patient identified by MRN, date of birth, ID band Patient awake    Reviewed: Allergy & Precautions, NPO status , Patient's Chart, lab work & pertinent test results  Airway Mallampati: II  TM Distance: >3 FB Neck ROM: Full    Dental  (+) Dental Advisory Given, Chipped,    Pulmonary former smoker   Pulmonary exam normal breath sounds clear to auscultation       Cardiovascular hypertension, Pt. on medications Normal cardiovascular exam Rhythm:Regular Rate:Normal     Neuro/Psych  PSYCHIATRIC DISORDERS  Depression    negative neurological ROS     GI/Hepatic Neg liver ROS,GERD  ,,  Endo/Other  negative endocrine ROS    Renal/GU negative Renal ROS  negative genitourinary   Musculoskeletal  (+) Arthritis ,    Abdominal   Peds  Hematology negative hematology ROS (+)   Anesthesia Other Findings   Reproductive/Obstetrics                              Anesthesia Physical Anesthesia Plan  ASA: 2  Anesthesia Plan: General   Post-op Pain Management: Ofirmev  IV (intra-op)*   Induction: Intravenous  PONV Risk Score and Plan: 3 and Ondansetron , Dexamethasone  and Treatment may vary due to age or medical condition  Airway Management Planned: LMA  Additional Equipment:   Intra-op Plan:   Post-operative Plan: Extubation in OR  Informed Consent: I have reviewed the patients History and Physical, chart, labs and discussed the procedure including the risks, benefits and alternatives for the proposed anesthesia with the patient or authorized representative who has indicated his/her understanding and acceptance.     Dental advisory given  Plan Discussed with: CRNA  Anesthesia Plan Comments:          Anesthesia Quick Evaluation

## 2023-11-20 NOTE — Anesthesia Procedure Notes (Signed)
 Procedure Name: LMA Insertion Date/Time: 11/20/2023 2:14 PM  Performed by: Pam Macario BROCKS, CRNAPre-anesthesia Checklist: Patient identified, Emergency Drugs available, Suction available, Patient being monitored and Timeout performed Patient Re-evaluated:Patient Re-evaluated prior to induction Oxygen Delivery Method: Circle system utilized Preoxygenation: Pre-oxygenation with 100% oxygen Induction Type: IV induction Ventilation: Mask ventilation without difficulty LMA: LMA inserted LMA Size: 3.0 Number of attempts: 1 Airway Equipment and Method: Bite block Placement Confirmation: positive ETCO2, CO2 detector and breath sounds checked- equal and bilateral Tube secured with: Tape Dental Injury: Teeth and Oropharynx as per pre-operative assessment

## 2023-11-20 NOTE — Anesthesia Postprocedure Evaluation (Signed)
 Anesthesia Post Note  Patient: Laura Bean  Procedure(s) Performed: LEFT THUMB TRIGGER RELEASE (Left: Thumb)     Patient location during evaluation: PACU Anesthesia Type: General Level of consciousness: awake and alert Pain management: pain level controlled Vital Signs Assessment: post-procedure vital signs reviewed and stable Respiratory status: spontaneous breathing, nonlabored ventilation, respiratory function stable and patient connected to nasal cannula oxygen Cardiovascular status: blood pressure returned to baseline and stable Postop Assessment: no apparent nausea or vomiting Anesthetic complications: no   No notable events documented.  Last Vitals:  Vitals:   11/20/23 1505 11/20/23 1507  BP: (!) 178/86 (!) 157/76  Pulse: 62 (!) 58  Resp: 15 18  Temp:    SpO2: 94% 94%    Last Pain:  Vitals:   11/20/23 1445  TempSrc:   PainSc: 0-No pain                 Lene Mckay L Janis Sol

## 2023-11-20 NOTE — Discharge Instructions (Addendum)
 [facility] Instructions Hand Surgery  Wound Care: Keep your hand elevated above the level of your heart.  Do not allow it to dangle by your side.  Keep the dressing dry and do not remove it unless your doctor advises you to do so.  He will usually change it at the time of your post-op visit.  Moving your fingers is advised to stimulate circulation but will depend on the site of your surgery.  If you have a splint applied, your doctor will advise you regarding movement.  Activity: Do not drive or operate machinery today.  Rest today and then you may return to your normal activity and work as indicated by your physician.  Diet:  Drink liquids today or eat a light diet.  You may resume a regular diet tomorrow.    General expectations: Pain for two to three days. Fingers may become slightly swollen.  Call your doctor if any of the following occur: Severe pain not relieved by pain medication. Elevated temperature. Dressing soaked with blood. Inability to move fingers. White or bluish color to fingers.   **No tylenol  until after 8:30 pm   Post Anesthesia Home Care Instructions  Activity: Get plenty of rest for the remainder of the day. A responsible individual must stay with you for 24 hours following the procedure.  For the next 24 hours, DO NOT: -Drive a car -Advertising copywriter -Drink alcoholic beverages -Take any medication unless instructed by your physician -Make any legal decisions or sign important papers.  Meals: Start with liquid foods such as gelatin or soup. Progress to regular foods as tolerated. Avoid greasy, spicy, heavy foods. If nausea and/or vomiting occur, drink only clear liquids until the nausea and/or vomiting subsides. Call your physician if vomiting continues.  Special Instructions/Symptoms: Your throat may feel dry or sore from the anesthesia or the breathing tube placed in your throat during surgery. If this causes discomfort, gargle with warm salt water.  The discomfort should disappear within 24 hours.  If you had a scopolamine  patch placed behind your ear for the management of post- operative nausea and/or vomiting:  1. The medication in the patch is effective for 72 hours, after which it should be removed.  Wrap patch in a tissue and discard in the trash. Wash hands thoroughly with soap and water. 2. You may remove the patch earlier than 72 hours if you experience unpleasant side effects which may include dry mouth, dizziness or visual disturbances. 3. Avoid touching the patch. Wash your hands with soap and water after contact with the patch.

## 2023-11-20 NOTE — H&P (Signed)
 Laura Bean is an 73 y.o. female.   Chief Complaint: trigger digit HPI: 73 y.o. yo female with triggering of left thumb.  This has been injected without lasting resolution. She wishes to proceed with surgical trigger release.   Allergies:  Allergies  Allergen Reactions   Lisinopril Cough    Other reaction(s): Cough (ALLERGY/intolerance)   Ibandronate     Other reaction(s): myalgias, headache (10/2020)   Mestinon  [Pyridostigmine  Bromide]     Past Medical History:  Diagnosis Date   Arthritis    Family history of adverse reaction to anesthesia    ? father confusion with anesthesia when older   GERD (gastroesophageal reflux disease)    occ   History of kidney stones    Hypertension    Osteoporosis     Past Surgical History:  Procedure Laterality Date   ADENOIDECTOMY     child   EYE SURGERY     cataract   ROUX-EN-Y GASTRIC BYPASS  2002   SHOULDER SURGERY Left 08/2022   SINUS EXPLORATION     THIGH LIFT     outer   TOTAL KNEE ARTHROPLASTY Left 08/14/2016   Procedure: TOTAL KNEE ARTHROPLASTY;  Surgeon: Liam Lerner, MD;  Location: [facility] OR;  Service: Orthopedics;  Laterality: Left;   TOTAL KNEE REVISION Left 08/25/2019   Procedure: LEFT TOTAL KNEE REVISION;  Surgeon: Liam Lerner, MD;  Location: [facility];  Service: Orthopedics;  Laterality: Left;   TOTAL SHOULDER REPLACEMENT Left 08/2022   TRIGGER FINGER RELEASE Right 11/01/2017   Procedure: RIGHT THUMB TRIGGER RELEASE;  Surgeon: Murrell Drivers, MD;  Location:  S[facility]ERY [facility];  Service: Orthopedics;  Laterality: Right;    Family History: Family History  Problem Relation Age of Onset   Aortic stenosis Mother    Arrhythmia Mother    Breast cancer Mother    Osteoarthritis Mother    Heart disease Mother    Aortic stenosis Father    Melanoma Father    Osteoarthritis Father    Breast cancer Sister     Social History:   reports that she quit smoking about 23 years ago. Her smoking use included cigarettes. She  started smoking about 48 years ago. She has a 2.5 pack-year smoking history. She has never used smokeless tobacco. She reports current alcohol use. She reports that she does not use drugs.  Medications: Medications Prior to Admission  Medication Sig Dispense Refill   acetaminophen  (TYLENOL  8 HOUR) 650 MG CR tablet 2 tablets Orally *every 8 hrs for pain (OVER THE COUNTER) as needed     Azelastine HCl 137 MCG/SPRAY SOLN 2 sprays.     B Complex Vitamins (VITAMIN-B COMPLEX PO) Take by mouth.     calcium  carbonate (OSCAL) 1500 (600 Ca) MG TABS tablet Take 600 mg of elemental calcium  by mouth 2 (two) times daily with a meal.      cholecalciferol  (VITAMIN D ) 1000 units tablet Take 1,000 Units by mouth daily.      denosumab -bbdz (JUBBONTI) 60 MG/ML SOSY Inject 60 mg into the skin every 6 (six) months. Courier to rheum: [address], [address], [place] [address]. Appt on 10/22/2023 1 mL 0   Famotidine (PEPCID PO) Take by mouth.     fluoruracil (CARAC) 0.5 % cream Apply 1 application topically 2 (two) times daily.     gabapentin  (NEURONTIN ) 100 MG capsule Take 100 mg by mouth at bedtime.     Multiple Vitamin (MULTIVITAMIN) capsule Take 1 capsule by mouth daily.  traZODone (DESYREL) 50 MG tablet 1 tablet at bedtime Orally Once a day     valsartan  (DIOVAN ) 40 MG tablet Take 40 mg by mouth daily.     vitamin B-12 (CYANOCOBALAMIN ) 1000 MCG tablet Take 1,000 mcg by mouth daily.      Semaglutide,0.25 or 0.5MG /DOS, 2 MG/1.5ML SOPN Inject into the skin.      No results found for this or any previous visit (from the past 48 hours).  No results found.    Height 5' 4 (1.626 m), weight 66.7 kg.  General appearance: alert, cooperative, and appears stated age Head: Normocephalic, without obvious abnormality, atraumatic Neck: supple, symmetrical, trachea midline Extremities: Intact sensation and capillary refill all digits.  +epl/fpl/io.  No wounds.  Skin: Skin color, texture, turgor normal. No  rashes or lesions Neurologic: Grossly normal Incision/Wound: none  Assessment/Plan Left thumb trigger digit.  Non operative and operative treatment options have been discussed with the patient and patient wishes to proceed with operative treatment. Risks, benefits, and alternatives of surgery have been discussed and the patient agrees with the plan of care.   Fleet Higham 11/20/2023, 12:32 PM

## 2023-11-20 NOTE — Op Note (Signed)
 11/20/2023 [facility] OPERATIVE REPORT   PREOPERATIVE DIAGNOSIS: Left trigger thumb.  POSTOPERATIVE DIAGNOSIS:  Left trigger thumb.  PROCEDURE: Left trigger thumb release.  SURGEON:  Rosan Calbert, MD  ASSISTANT:  Zac Currence, PAC.  ANESTHESIA:  General.  IV FLUIDS:  Per anesthesia flow sheet.  ESTIMATED BLOOD LOSS:  Minimal.  COMPLICATIONS:  None.  SPECIMENS:  None.  TOURNIQUET TIME:  Total Tourniquet Time Documented: Upper Arm (Left) - 10 minutes Total: Upper Arm (Left) - 10 minutes   DISPOSITION:  Stable to PACU.  LOCATION: [facility]  INDICATIONS: Laura Bean is a 73 y.o. female with triggering of the thumb.  This has been injected without lasting resolution.  She wishes to proceed with surgical trigger release.  Risks, benefits and alternatives of surgery were discussed including the risk of blood loss, infection, damage to nerves, vessels, tendons, ligaments, bone, failure of surgery, need for additional surgery, complications with wound healing, continued pain, continued triggering and need for repeat surgery.  She voiced understanding of these risks and elected to proceed.  OPERATIVE COURSE:  After being identified preoperatively by myself, the patient and I agreed upon the procedure and site of procedure.  The surgical site was marked. Surgical consent had been signed. She was given IV Ancef  as preoperative antibiotic prophylaxis. She was transported to the operating room and placed on the operating room table in supine position with the Left upper extremity on an arm board. General anesthesia was induced by the anesthesiologist.  The Left upper extremity was prepped and draped in normal sterile orthopedic fashion. A surgical pause was performed between surgeons, anesthesia, and operating room staff, and all were in agreement as to the patient, procedure, and site of procedure.  Tourniquet at the proximal aspect of the extremity was  inflated to 250 mmHg after exsanguination of the arm with an Esmarch bandage.  An incision was made transversely at the MP flexion crease of the thumb.  This was made through the skin only.  Spreading technique was used.  The radial and ulnar digital nerves were identified and were protected throughout the case. The flexor sheath was identified.  The A1 pulley was identified.  It was sharply incised.  It was released in its entirety.  Care was taken to avoid any release of the oblique pulley. The thumb was placed through a range of motion, there was noted to be no catching.  The wound was copiously irrigated with sterile saline. It was then closed with 4-0 nylon in a horizontal mattress fashion.  The wound was injected with  0.25% plain Marcaine  to aid in postoperative analgesia.  It was then dressed with sterile Xeroform, 4x4s, and wrapped lightly with a Coban dressing.  Tourniquet was deflated at 10 minutes.  The fingertips were pink with brisk capillary refill after deflation of the tourniquet.  The operative drapes were broken down and the patient was awoken from anesthesia safely.  She was transferred back to the stretcher and taken to the PACU in stable condition.   I will see her back in the office in 1 week for postoperative followup.  I will give her a prescription for Norco 5/325 1 tab PO q6 hours prn pain, dispense #15.    Brisa Auth, MD Electronically signed, 11/20/23

## 2023-11-20 NOTE — Transfer of Care (Signed)
 Immediate Anesthesia Transfer of Care Note  Patient: Laura Bean  Procedure(s) Performed: LEFT THUMB TRIGGER RELEASE (Left: Thumb)  Patient Location: PACU  Anesthesia Type:General  Level of Consciousness: awake and alert   Airway & Oxygen Therapy: Patient Spontanous Breathing and Patient connected to nasal cannula oxygen  Post-op Assessment: Report given to RN  Post vital signs: Reviewed and stable  Last Vitals:  Vitals Value Taken Time  BP    Temp    Pulse 77 11/20/23 14:45  Resp 19 11/20/23 14:45  SpO2 96 % 11/20/23 14:45  Vitals shown include unfiled device data.  Last Pain:  Vitals:   11/20/23 1251  TempSrc: Temporal  PainSc: 3       Patients Stated Pain Goal: 5 (11/20/23 1251)  Complications: No notable events documented.

## 2023-11-21 ENCOUNTER — Encounter (HOSPITAL_BASED_OUTPATIENT_CLINIC_OR_DEPARTMENT_OTHER): Payer: Self-pay | Admitting: Orthopedic Surgery

## 2024-02-28 ENCOUNTER — Ambulatory Visit: Admitting: Neurology

## 2024-04-10 ENCOUNTER — Ambulatory Visit: Admitting: Internal Medicine
